# Patient Record
Sex: Male | Born: 1965
Health system: Western US, Academic
[De-identification: ages and names within clinical notes are randomized; demographics above are authoritative.]

## PROBLEM LIST (undated history)

## (undated) DIAGNOSIS — T82898A Other specified complication of vascular prosthetic devices, implants and grafts, initial encounter: Secondary | ICD-10-CM

## (undated) DIAGNOSIS — R0602 Shortness of breath: Secondary | ICD-10-CM

## (undated) DIAGNOSIS — N289 Disorder of kidney and ureter, unspecified: Secondary | ICD-10-CM

## (undated) DIAGNOSIS — Z992 Dependence on renal dialysis: Secondary | ICD-10-CM

## (undated) DIAGNOSIS — E119 Type 2 diabetes mellitus without complications: Secondary | ICD-10-CM

## (undated) DIAGNOSIS — Z8711 Personal history of peptic ulcer disease: Secondary | ICD-10-CM

## (undated) DIAGNOSIS — Z9289 Personal history of other medical treatment: Secondary | ICD-10-CM

## (undated) DIAGNOSIS — J42 Unspecified chronic bronchitis: Secondary | ICD-10-CM

## (undated) DIAGNOSIS — F419 Anxiety disorder, unspecified: Secondary | ICD-10-CM

## (undated) DIAGNOSIS — I10 Essential (primary) hypertension: Secondary | ICD-10-CM

## (undated) DIAGNOSIS — J189 Pneumonia, unspecified organism: Secondary | ICD-10-CM

## (undated) DIAGNOSIS — N186 End stage renal disease: Secondary | ICD-10-CM

## (undated) DIAGNOSIS — Z8719 Personal history of other diseases of the digestive system: Secondary | ICD-10-CM

## (undated) DIAGNOSIS — M1990 Unspecified osteoarthritis, unspecified site: Secondary | ICD-10-CM

## (undated) HISTORY — PX: FISTULAGRAM (ARMC HX): HXRAD1277

## (undated) HISTORY — PX: FOREIGN BODY REMOVAL: SHX962

## (undated) HISTORY — PX: ARTERIOVENOUS GRAFT PLACEMENT: SUR1029

---

## 2001-07-21 HISTORY — PX: AV FISTULA PLACEMENT: SHX1204

## 2014-01-03 ENCOUNTER — Encounter: Payer: Self-pay | Admitting: Transplant Surgery

## 2014-01-05 ENCOUNTER — Encounter: Payer: Self-pay | Admitting: OTHER M.D.

## 2016-06-18 ENCOUNTER — Inpatient Hospital Stay (HOSPITAL_BASED_OUTPATIENT_CLINIC_OR_DEPARTMENT_OTHER)
Admission: EM | Admit: 2016-06-18 | Discharge: 2016-06-22 | DRG: 280 | Disposition: A | Discharge status: Home / Self Care | Payer: Medicaid Other | Attending: Internal Medicine | Admitting: Internal Medicine

## 2016-06-18 ENCOUNTER — Encounter (HOSPITAL_BASED_OUTPATIENT_CLINIC_OR_DEPARTMENT_OTHER): Payer: Self-pay | Admitting: *Deleted

## 2016-06-18 ENCOUNTER — Emergency Department (HOSPITAL_BASED_OUTPATIENT_CLINIC_OR_DEPARTMENT_OTHER): Payer: Medicaid Other

## 2016-06-18 DIAGNOSIS — R9439 Abnormal result of other cardiovascular function study: Secondary | ICD-10-CM | POA: Diagnosis not present

## 2016-06-18 DIAGNOSIS — Z8249 Family history of ischemic heart disease and other diseases of the circulatory system: Secondary | ICD-10-CM | POA: Diagnosis not present

## 2016-06-18 DIAGNOSIS — N250 Renal osteodystrophy: Secondary | ICD-10-CM | POA: Diagnosis present

## 2016-06-18 DIAGNOSIS — Z7982 Long term (current) use of aspirin: Secondary | ICD-10-CM | POA: Diagnosis not present

## 2016-06-18 DIAGNOSIS — D696 Thrombocytopenia, unspecified: Secondary | ICD-10-CM | POA: Diagnosis present

## 2016-06-18 DIAGNOSIS — I219 Acute myocardial infarction, unspecified: Secondary | ICD-10-CM

## 2016-06-18 DIAGNOSIS — M899 Disorder of bone, unspecified: Secondary | ICD-10-CM | POA: Diagnosis present

## 2016-06-18 DIAGNOSIS — D631 Anemia in chronic kidney disease: Secondary | ICD-10-CM | POA: Diagnosis present

## 2016-06-18 DIAGNOSIS — Z880 Allergy status to penicillin: Secondary | ICD-10-CM

## 2016-06-18 DIAGNOSIS — D649 Anemia, unspecified: Secondary | ICD-10-CM | POA: Diagnosis present

## 2016-06-18 DIAGNOSIS — Z940 Kidney transplant status: Secondary | ICD-10-CM

## 2016-06-18 DIAGNOSIS — E8779 Other fluid overload: Secondary | ICD-10-CM

## 2016-06-18 DIAGNOSIS — I160 Hypertensive urgency: Secondary | ICD-10-CM | POA: Diagnosis present

## 2016-06-18 DIAGNOSIS — R7989 Other specified abnormal findings of blood chemistry: Secondary | ICD-10-CM

## 2016-06-18 DIAGNOSIS — I132 Hypertensive heart and chronic kidney disease with heart failure and with stage 5 chronic kidney disease, or end stage renal disease: Secondary | ICD-10-CM | POA: Diagnosis present

## 2016-06-18 DIAGNOSIS — J9811 Atelectasis: Secondary | ICD-10-CM | POA: Diagnosis present

## 2016-06-18 DIAGNOSIS — F419 Anxiety disorder, unspecified: Secondary | ICD-10-CM | POA: Diagnosis present

## 2016-06-18 DIAGNOSIS — I119 Hypertensive heart disease without heart failure: Secondary | ICD-10-CM | POA: Diagnosis present

## 2016-06-18 DIAGNOSIS — J45909 Unspecified asthma, uncomplicated: Secondary | ICD-10-CM | POA: Diagnosis present

## 2016-06-18 DIAGNOSIS — R9431 Abnormal electrocardiogram [ECG] [EKG]: Secondary | ICD-10-CM | POA: Diagnosis present

## 2016-06-18 DIAGNOSIS — Z8711 Personal history of peptic ulcer disease: Secondary | ICD-10-CM | POA: Diagnosis not present

## 2016-06-18 DIAGNOSIS — J42 Unspecified chronic bronchitis: Secondary | ICD-10-CM | POA: Diagnosis present

## 2016-06-18 DIAGNOSIS — Z79899 Other long term (current) drug therapy: Secondary | ICD-10-CM | POA: Diagnosis not present

## 2016-06-18 DIAGNOSIS — Z881 Allergy status to other antibiotic agents status: Secondary | ICD-10-CM | POA: Diagnosis not present

## 2016-06-18 DIAGNOSIS — R0789 Other chest pain: Secondary | ICD-10-CM | POA: Diagnosis present

## 2016-06-18 DIAGNOSIS — E872 Acidosis: Secondary | ICD-10-CM | POA: Diagnosis present

## 2016-06-18 DIAGNOSIS — E119 Type 2 diabetes mellitus without complications: Secondary | ICD-10-CM

## 2016-06-18 DIAGNOSIS — R0902 Hypoxemia: Secondary | ICD-10-CM | POA: Diagnosis present

## 2016-06-18 DIAGNOSIS — E1122 Type 2 diabetes mellitus with diabetic chronic kidney disease: Secondary | ICD-10-CM | POA: Diagnosis present

## 2016-06-18 DIAGNOSIS — Z888 Allergy status to other drugs, medicaments and biological substances status: Secondary | ICD-10-CM | POA: Diagnosis not present

## 2016-06-18 DIAGNOSIS — E785 Hyperlipidemia, unspecified: Secondary | ICD-10-CM | POA: Diagnosis present

## 2016-06-18 DIAGNOSIS — I509 Heart failure, unspecified: Secondary | ICD-10-CM | POA: Diagnosis not present

## 2016-06-18 DIAGNOSIS — Z992 Dependence on renal dialysis: Secondary | ICD-10-CM

## 2016-06-18 DIAGNOSIS — I255 Ischemic cardiomyopathy: Secondary | ICD-10-CM | POA: Diagnosis present

## 2016-06-18 DIAGNOSIS — I5021 Acute systolic (congestive) heart failure: Secondary | ICD-10-CM | POA: Diagnosis present

## 2016-06-18 DIAGNOSIS — R079 Chest pain, unspecified: Secondary | ICD-10-CM | POA: Diagnosis not present

## 2016-06-18 DIAGNOSIS — N186 End stage renal disease: Secondary | ICD-10-CM | POA: Diagnosis present

## 2016-06-18 DIAGNOSIS — I2119 ST elevation (STEMI) myocardial infarction involving other coronary artery of inferior wall: Secondary | ICD-10-CM | POA: Diagnosis present

## 2016-06-18 DIAGNOSIS — N2581 Secondary hyperparathyroidism of renal origin: Secondary | ICD-10-CM | POA: Diagnosis present

## 2016-06-18 DIAGNOSIS — R600 Localized edema: Secondary | ICD-10-CM | POA: Diagnosis present

## 2016-06-18 DIAGNOSIS — I159 Secondary hypertension, unspecified: Secondary | ICD-10-CM

## 2016-06-18 DIAGNOSIS — R072 Precordial pain: Secondary | ICD-10-CM | POA: Diagnosis not present

## 2016-06-18 DIAGNOSIS — I10 Essential (primary) hypertension: Secondary | ICD-10-CM | POA: Diagnosis not present

## 2016-06-18 DIAGNOSIS — I25119 Atherosclerotic heart disease of native coronary artery with unspecified angina pectoris: Secondary | ICD-10-CM | POA: Diagnosis not present

## 2016-06-18 DIAGNOSIS — I2510 Atherosclerotic heart disease of native coronary artery without angina pectoris: Secondary | ICD-10-CM

## 2016-06-18 DIAGNOSIS — E8729 Other acidosis: Secondary | ICD-10-CM

## 2016-06-18 DIAGNOSIS — R778 Other specified abnormalities of plasma proteins: Secondary | ICD-10-CM

## 2016-06-18 HISTORY — DX: Personal history of other medical treatment: Z92.89

## 2016-06-18 HISTORY — DX: Essential (primary) hypertension: I10

## 2016-06-18 HISTORY — DX: Unspecified chronic bronchitis: J42

## 2016-06-18 HISTORY — DX: Personal history of peptic ulcer disease: Z87.11

## 2016-06-18 HISTORY — DX: Dependence on renal dialysis: Z99.2

## 2016-06-18 HISTORY — DX: Unspecified osteoarthritis, unspecified site: M19.90

## 2016-06-18 HISTORY — DX: Dependence on renal dialysis: N18.6

## 2016-06-18 HISTORY — DX: Pneumonia, unspecified organism: J18.9

## 2016-06-18 HISTORY — DX: Personal history of other diseases of the digestive system: Z87.19

## 2016-06-18 HISTORY — DX: Disorder of kidney and ureter, unspecified: N28.9

## 2016-06-18 HISTORY — DX: Anxiety disorder, unspecified: F41.9

## 2016-06-18 HISTORY — DX: Type 2 diabetes mellitus without complications: E11.9

## 2016-06-18 LAB — BASIC METABOLIC PANEL
ANION GAP: 18 — AB (ref 5–15)
BUN: 60 mg/dL — ABNORMAL HIGH (ref 6–20)
CO2: 24 mmol/L (ref 22–32)
Calcium: 8 mg/dL — ABNORMAL LOW (ref 8.9–10.3)
Chloride: 97 mmol/L — ABNORMAL LOW (ref 101–111)
Creatinine, Ser: 21.06 mg/dL — ABNORMAL HIGH (ref 0.61–1.24)
GFR calc Af Amer: 2 mL/min — ABNORMAL LOW (ref >60)
GFR, EST NON AFRICAN AMERICAN: 2 mL/min — AB (ref >60)
GLUCOSE: 109 mg/dL — AB (ref 65–99)
POTASSIUM: 3.8 mmol/L (ref 3.5–5.1)
SODIUM: 139 mmol/L (ref 135–145)

## 2016-06-18 LAB — CBC WITH DIFFERENTIAL/PLATELET
BASOS ABS: 0 10*3/uL (ref 0.0–0.1)
Basophils Relative: 0 %
Eosinophils Absolute: 0.2 10*3/uL (ref 0.0–0.7)
Eosinophils Relative: 6 %
HEMATOCRIT: 34.9 % — AB (ref 39.0–52.0)
HEMOGLOBIN: 11.6 g/dL — AB (ref 13.0–17.0)
LYMPHS PCT: 25 %
Lymphs Abs: 1 10*3/uL (ref 0.7–4.0)
MCH: 30.8 pg (ref 26.0–34.0)
MCHC: 33.2 g/dL (ref 30.0–36.0)
MCV: 92.6 fL (ref 78.0–100.0)
MONOS PCT: 11 %
Monocytes Absolute: 0.4 10*3/uL (ref 0.1–1.0)
Neutro Abs: 2.4 10*3/uL (ref 1.7–7.7)
Neutrophils Relative %: 58 %
PLATELETS: 95 10*3/uL — AB (ref 150–400)
RBC: 3.77 MIL/uL — AB (ref 4.22–5.81)
RDW: 15 % (ref 11.5–15.5)
WBC: 4 10*3/uL (ref 4.0–10.5)

## 2016-06-18 LAB — GLUCOSE, CAPILLARY: Glucose-Capillary: 91 mg/dL (ref 65–99)

## 2016-06-18 LAB — TROPONIN I
TROPONIN I: 0.06 ng/mL — AB (ref <0.03)
Troponin I: 0.06 ng/mL (ref <0.03)

## 2016-06-18 LAB — HEPATITIS B SURFACE ANTIGEN: Hepatitis B Surface Ag: NEGATIVE

## 2016-06-18 LAB — MRSA PCR SCREENING: MRSA BY PCR: NEGATIVE

## 2016-06-18 LAB — ALT: ALT: 30 U/L (ref 17–63)

## 2016-06-18 MED ORDER — METOPROLOL TARTRATE 50 MG PO TABS
50.0000 mg | ORAL_TABLET | Freq: Two times a day (BID) | ORAL | Status: DC
  Administered 2016-06-19: 50 mg via ORAL
  Filled 2016-06-18: qty 1

## 2016-06-18 MED ORDER — CALCITRIOL 0.5 MCG PO CAPS
0.5000 ug | ORAL_CAPSULE | ORAL | Status: DC
  Administered 2016-06-20: 0.5 ug via ORAL
  Filled 2016-06-18: qty 2

## 2016-06-18 MED ORDER — AMLODIPINE BESYLATE 5 MG PO TABS
5.0000 mg | ORAL_TABLET | Freq: Every day | ORAL | Status: DC
  Administered 2016-06-19 – 2016-06-21 (×3): 5 mg via ORAL
  Filled 2016-06-18 (×3): qty 1

## 2016-06-18 MED ORDER — ACETAMINOPHEN 650 MG RE SUPP: 650.0000 mg | Freq: Four times a day (QID) | RECTAL | Status: DC | PRN

## 2016-06-18 MED ORDER — HYDROCODONE-ACETAMINOPHEN 5-325 MG PO TABS: 1.0000 | ORAL_TABLET | ORAL | Status: DC | PRN

## 2016-06-18 MED ORDER — RENA-VITE PO TABS
1.0000 | ORAL_TABLET | Freq: Every day | ORAL | Status: DC
  Administered 2016-06-19 – 2016-06-21 (×3): 1 via ORAL
  Filled 2016-06-18 (×3): qty 1

## 2016-06-18 MED ORDER — MORPHINE SULFATE (PF) 4 MG/ML IV SOLN
4.0000 mg | Freq: Once | INTRAVENOUS | Status: AC
  Administered 2016-06-18: 4 mg via INTRAVENOUS
  Filled 2016-06-18: qty 1

## 2016-06-18 MED ORDER — ONDANSETRON HCL 4 MG/2ML IJ SOLN: 4.0000 mg | Freq: Four times a day (QID) | INTRAMUSCULAR | Status: DC | PRN

## 2016-06-18 MED ORDER — ACETAMINOPHEN 325 MG PO TABS: 650.0000 mg | ORAL_TABLET | Freq: Four times a day (QID) | ORAL | Status: DC | PRN

## 2016-06-18 MED ORDER — ASPIRIN 81 MG PO CHEW
324.0000 mg | CHEWABLE_TABLET | Freq: Once | ORAL | Status: AC
  Administered 2016-06-18: 324 mg via ORAL
  Filled 2016-06-18: qty 4

## 2016-06-18 MED ORDER — NITROGLYCERIN 2 % TD OINT
1.0000 [in_us] | TOPICAL_OINTMENT | Freq: Once | TRANSDERMAL | Status: AC
  Administered 2016-06-18: 1 [in_us] via TOPICAL
  Filled 2016-06-18: qty 1

## 2016-06-18 MED ORDER — MORPHINE SULFATE (PF) 2 MG/ML IV SOLN
1.0000 mg | INTRAVENOUS | Status: DC | PRN
  Administered 2016-06-19: 1 mg via INTRAVENOUS
  Filled 2016-06-18: qty 1

## 2016-06-18 MED ORDER — HYDRALAZINE HCL 20 MG/ML IJ SOLN
5.0000 mg | INTRAMUSCULAR | Status: DC | PRN
  Administered 2016-06-19 – 2016-06-20 (×3): 5 mg via INTRAVENOUS
  Filled 2016-06-18 (×3): qty 1

## 2016-06-18 MED ORDER — CINACALCET HCL 30 MG PO TABS
60.0000 mg | ORAL_TABLET | Freq: Every day | ORAL | Status: DC
  Administered 2016-06-19 – 2016-06-22 (×4): 60 mg via ORAL
  Filled 2016-06-18 (×4): qty 2

## 2016-06-18 MED ORDER — IPRATROPIUM-ALBUTEROL 18-103 MCG/ACT IN AERO: 2.0000 | INHALATION_SPRAY | RESPIRATORY_TRACT | Status: DC | PRN

## 2016-06-18 MED ORDER — INSULIN ASPART 100 UNIT/ML ~~LOC~~ SOLN: 0.0000 [IU] | Freq: Three times a day (TID) | SUBCUTANEOUS | Status: DC

## 2016-06-18 MED ORDER — IPRATROPIUM-ALBUTEROL 0.5-2.5 (3) MG/3ML IN SOLN: 3.0000 mL | RESPIRATORY_TRACT | Status: DC | PRN

## 2016-06-18 MED ORDER — ONDANSETRON HCL 4 MG PO TABS: 4.0000 mg | ORAL_TABLET | Freq: Four times a day (QID) | ORAL | Status: DC | PRN

## 2016-06-18 MED ORDER — SODIUM CHLORIDE 0.9% FLUSH
3.0000 mL | Freq: Two times a day (BID) | INTRAVENOUS | Status: DC
  Administered 2016-06-19 – 2016-06-22 (×7): 3 mL via INTRAVENOUS

## 2016-06-18 NOTE — ED Notes (Signed)
MD at bedside. 

## 2016-06-18 NOTE — ED Provider Notes (Signed)
CSN: [id number]     Arrival date & time 06/18/16  1005 History   First MD Initiated Contact with Patient 06/18/16 1012     Chief Complaint  Patient presents with  . Shortness of Breath     (Consider location/radiation/quality/duration/timing/severity/associated sxs/prior Treatment) HPI   Jeffery Riley is a(n) 50 y.o. male who presents TO THE ed With chief complaint of needing dialysis. Patient states that he just moved to the area and had difficulty establishing follow-up care due to insurance issues. The patient was scheduled to dialyze today , however, when he went he states that they've turned him away because he needed a physical. He came to the emergency department. 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 He states he is very disciplined with his care, but has never missed a dialysis session. He states that he has had worsening shortness of breath and chest pressure beginning 2 days ago. His last dialysis was 5 days ago. He has also noticed swelling in his ankles for the past 3 days which is new.Denies fevers, chills, myalgias, arthralgias. Denies chest pain. radiation to left arm, jaw or back, or diaphoresis. . Denies headaches, light headedness, weakness, visual disturbances. Denies abdominal pain, nausea, vomiting, diarrhea or constipation.    Past Medical History  Diagnosis Date  . Dialysis patient (HCC)   . Hypertension   . Diabetes mellitus without complication (HCC)   . Blood transfusion without reported diagnosis   . Anxiety    Past Surgical History  Procedure Laterality Date  . Av fistula placement     No family history on file. Social History  Substance Use Topics  . Smoking status: Never Smoker   . Smokeless tobacco: Never Used  . Alcohol Use: No    Review of Systems  Ten systems reviewed and are negative for acute change,  except as noted in the HPI.    Allergies  Penicillins; Clonidine derivatives; Compazine; and Levaquin  Home Medications   Prior to Admission medications   Medication Sig Start Date End Date Taking? Authorizing Provider  albuterol-ipratropium (COMBIVENT) 18-103 MCG/ACT inhaler Inhale 2 puffs into the lungs every 4 (four) hours as needed for wheezing or shortness of breath.   Yes Historical Provider, MD  amLODipine (NORVASC) 5 MG tablet Take 5 mg by mouth daily.   Yes Historical Provider, MD  cinacalcet (SENSIPAR) 60 MG tablet Take 60 mg by mouth daily.   Yes Historical Provider, MD  metoprolol (LOPRESSOR) 50 MG tablet Take 50 mg by mouth 2 (two) times daily.   Yes Historical Provider, MD   BP 169/93 mmHg  Pulse 92  Temp(Src) 99.1 F (37.3 C) (Oral)  Resp 18  Ht 5\' 7"  (1.702 m)  Wt 68.947 kg  BMI 23.80 kg/m2  SpO2 97% Physical Exam  Constitutional: He appears well-developed and well-nourished. No distress.  HENT:  Head: Normocephalic and atraumatic.  Eyes: Conjunctivae are normal. No scleral icterus.  Neck: Normal range of motion. Neck supple. JVD present.  JVD Measures the length of his neck. Venous distension across the forehead.  Cardiovascular: Normal rate, regular rhythm and normal heart sounds.   2+ pitting edema BL LE  Pulmonary/Chest: Effort normal and breath sounds normal. No respiratory distress. He has no rales.  Dyspneic at rest  Abdominal: Soft. There is no tenderness.  Musculoskeletal: He exhibits no edema.  Neurological:  He is alert.  Skin: Skin is warm and dry. He is not diaphoretic.  Psychiatric: His behavior is normal.  Nursing note and vitals reviewed.   ED Course  Procedures (including critical care time) Labs Review Labs Reviewed  BASIC METABOLIC PANEL - Abnormal; Notable for the following:    Chloride 97 (*)    Glucose, Bld 109 (*)    BUN 60 (*)    Creatinine, Ser 21.06 (*)    Calcium 8.0 (*)    GFR calc non Af Amer 2 (*)    GFR calc Af Amer 2  (*)    Anion gap 18 (*)    All other components within normal limits  CBC WITH DIFFERENTIAL/PLATELET - Abnormal; Notable for the following:    RBC 3.77 (*)    Hemoglobin 11.6 (*)    HCT 34.9 (*)    Platelets 95 (*)    All other components within normal limits  TROPONIN I - Abnormal; Notable for the following:    Troponin I 0.06 (*)    All other components within normal limits    Imaging Review Dg Chest 2 View  06/18/2016  CLINICAL DATA:  Shortness of breath, chest pain for 3 days EXAM: CHEST  2 VIEW COMPARISON:  6/27/ 17 FINDINGS: Cardiomegaly again noted. No infiltrate or pleural effusion. No pulmonary edema. Mild degenerative changes thoracic spine again noted. IMPRESSION: No active cardiopulmonary disease.  Cardiomegaly again noted. Electronically Signed   By: Natasha Mead M.D.   On: 06/18/2016 11:26   I have personally reviewed and evaluated these images and lab results as part of my medical decision-making.   EKG Interpretation   Date/Time:  Thursday June 18 2016 10:31:25 EDT Ventricular Rate:  92 PR Interval:    QRS Duration: 90 QT Interval:  421 QTC Calculation: 521 R Axis:   39 Text Interpretation:  Sinus rhythm Probable left atrial enlargement RSR'  in V1 or V2, right VCD or RVH Prolonged QT interval No old tracing to  compare Confirmed by JACUBOWITZ  MD, SAM [phone]) on 06/18/2016 10:44:01 AM      MDM   Final diagnoses:  Elevated troponin  ESRD (end stage renal disease) on dialysis (HCC)  Other hypervolemia  Increased anion gap metabolic acidosis    I spoke with the nurse and the social worker at  [facility] on [address]  Patient stated they had been trying to contact the patient repeated the. He was set up for an HNP with [facility] yesterday. However, they were unable to tell him when his appointment was. It would be unable to dialyze the patient today because he has not yet had his H&P.     1:33 PM BP 169/93 mmHg  Pulse 92   Temp(Src) 99.1 F (37.3 C) (Oral)  Resp 18  Ht  (1.702 m)  Wt 68.947 kg  BMI 23.80 kg/m2  SpO2 97% Patient CP free after morphine and nitro paste   1:53 PM Spoke with DR. Schertz. Patient will be dialyzed today. Patient accepted for admission by Dr.Elgargawy.   Arthor Captain, PA-C 06/18/16 1628  Doug Sou, MD 06/19/16 [phone]

## 2016-06-18 NOTE — Progress Notes (Signed)
49 year old male with ESRD on dialysis for 35 years, presents to [facility] as he missed his dialysis for last 5 days, recently moved from [place], unfortunately dialysis could not be arranged with for [facility] secondary to insurance issues and encouraged to contact information , agent presents with dyspnea and chest pain , lab significant for creatinine of 21, but no hyperkalemia, vital signs stable, no pulmonary edema on chest x-ray, patient accepted to stepdown to start on dialysis, but if he is chest pain-free he can come to telemetry 6 [place]. requested ED to discuss with renal. Huey Bienenstock MD.

## 2016-06-18 NOTE — ED Notes (Signed)
[facility] is aware of transfer to 3S10 at [facility]

## 2016-06-18 NOTE — Consult Note (Signed)
[facility] Renal Consultation Note  Indication for Consultation:  Management of ESRD/hemodialysis; anemia, hypertension/volume and secondary hyperparathyroidism  HPI: Jeffery Riley is a 50 y.o. male with ESRD sec HTN ,(Chrionic HD  Schedule TTS) last HD was Saturday 06/12/16 in [place] , [place] care [facility] but unable to communicate with Pt to start him on schedule in [facility].He presented to [facility] ER in [place]  a 3 day history of intermittent left anterior chest "pressure"and sob with exertion and lower extremity edema. He tells me "Have never missed a day of dialysis in 28 years .Denies fevers, chills, N/V/D.  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 Has Right upper arm AVGG in past 13 years .Has no urine out put. "control my volume with exercising =running and swimming. 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 .    Moving  To [place] and will live in  in [place] to live with mother in [place] [facility]. Per friend in room will live off [address] .      Past Medical History  Diagnosis Date  . Dialysis patient ([place])   . Hypertension   . Diabetes mellitus without complication ([place])   . Blood transfusion without reported diagnosis   . Anxiety   . Renal disorder   . ESRD (end stage renal disease) ([place])     tues thurs sat    Past Surgical History  Procedure Laterality Date  . Av fistula placement       Family history= ho hypertension/ no family members on HD  Social history= HE denies tobacco, illict drug or tobacco "ever''   reports that he has never smoked. He has never used smokeless tobacco. He reports that he does not drink alcohol or use illicit drugs.   Allergies  Allergen Reactions  . Penicillins Shortness Of Breath  . Clonidine Derivatives Other  (See Comments)    Muscle spasms  . Compazine [Prochlorperazine] Other (See Comments)    hallucinations  . Levaquin [Levofloxacin In D5w] Diarrhea    Prior to Admission medications   Medication Sig Start Date End Date Taking? Authorizing Provider  albuterol-ipratropium (COMBIVENT) 18-103 MCG/ACT inhaler Inhale 2 puffs into the lungs every 4 (four) hours as needed for wheezing or shortness of breath.   Yes Historical Provider, MD  amLODipine (NORVASC) 5 MG tablet Take 5 mg by mouth daily.   Yes Historical Provider, MD  cinacalcet (SENSIPAR) 60 MG tablet Take 60 mg by mouth daily.   Yes Historical Provider, MD  metoprolol (LOPRESSOR) 50 MG tablet Take 50 mg by mouth 2 (two) times daily.   Yes Historical Provider, MD    GPQ:DIYMEBRAXENMM **OR** acetaminophen, hydrALAZINE, HYDROcodone-acetaminophen, ipratropium-albuterol, morphine injection, ondansetron **OR** ondansetron (ZOFRAN) IV  Results for orders placed or performed during the hospital encounter of 06/18/16 (from the past 48 hour(s))  Basic metabolic panel     Status: Abnormal   Collection Time: 06/18/16 11:15 AM  Result Value Ref Range   Sodium 139 135 - 145 mmol/L   Potassium 3.8 3.5 - 5.1 mmol/L   Chloride 97 (L) 101 - 111 mmol/L   CO2 24 22 - 32 mmol/L   Glucose, Bld 109 (H) 65 - 99 mg/dL   BUN 60 (H) 6 - 20 mg/dL  Creatinine, Ser 21.06 (H) 0.61 - 1.24 mg/dL   Calcium 8.0 (L) 8.9 - 10.3 mg/dL   GFR calc non Af Amer 2 (L) >60 mL/min   GFR calc Af Amer 2 (L) >60 mL/min    Comment: (NOTE) The eGFR has been calculated using the CKD EPI equation. This calculation has not been validated in all clinical situations. eGFR's persistently <60 mL/min signify possible Chronic Kidney Disease.    Anion gap 18 (H) 5 - 15  CBC with Differential     Status: Abnormal   Collection Time: 06/18/16 11:15 AM  Result Value Ref Range   WBC 4.0 4.0 - 10.5 K/uL   RBC 3.77 (L) 4.22 - 5.81 MIL/uL   Hemoglobin 11.6 (L) 13.0 - 17.0 g/dL   HCT 34.9  (L) 39.0 - 52.0 %   MCV 92.6 78.0 - 100.0 fL   MCH 30.8 26.0 - 34.0 pg   MCHC 33.2 30.0 - 36.0 g/dL   RDW 15.0 11.5 - 15.5 %   Platelets 95 (L) 150 - 400 K/uL    Comment: REPEATED TO VERIFY SPECIMEN CHECKED FOR CLOTS PLATELET COUNT CONFIRMED BY SMEAR    Neutrophils Relative % 58 %   Lymphocytes Relative 25 %   Monocytes Relative 11 %   Eosinophils Relative 6 %   Basophils Relative 0 %   Neutro Abs 2.4 1.7 - 7.7 K/uL   Lymphs Abs 1.0 0.7 - 4.0 K/uL   Monocytes Absolute 0.4 0.1 - 1.0 K/uL   Eosinophils Absolute 0.2 0.0 - 0.7 K/uL   Basophils Absolute 0.0 0.0 - 0.1 K/uL   Smear Review LARGE PLATELETS PRESENT     Comment: SPECIMEN CHECKED FOR CLOTS PLATELET COUNT CONFIRMED BY SMEAR PLATELETS APPEAR DECREASED   Troponin I     Status: Abnormal   Collection Time: 06/18/16 11:15 AM  Result Value Ref Range   Troponin I 0.06 (HH) <0.03 ng/mL    Comment: REPEATED TO VERIFY CRITICAL RESULT CALLED TO, READ BACK BY AND VERIFIED WITH: JOSS BENTON RN @1220  06/18/16 OLSONM    .  ROS: see hpi  Physical Exam: Filed Vitals:   06/18/16 1430 06/18/16 1536  BP: 167/94 178/103  Pulse: 95 93  Temp:  98 F (36.7 C)  Resp: 17      General: alert AAM WD, WN, NAD OX3 / pleasant  HEENT: [place] , MMM Neck: no jvd, supple  Heart: RRR, no mur. rub,or gallop Lungs: CTA , Non labored breathijng Abdomen: BS pos , soft ,Nontender, non distended mass,no mass or ascites Extremities: bipedal edema 2+ Left 1+right Skin: no overt rash Neuro: alert ox3. No acute focal deficits noted No asterixis  Dialysis Access: RUA AVG pos. Bruit / mild aneurysmal areas  Dialysis Orders: Center: last in [place]  [facility]   on TTS  . EDW 68kg per pt /67 records  HD Bath 2k/ 2.25ca Time 3hr 20mn Heparin 2000. Access RUA AVG     Po calcitriol 0.529m /HD     Mircera 10062mq2 weeks last given 05/26/16   Venofer  100 mg load through 06/20/16  Other [place] from [place] [facility].4 (05/26/16) tfs 21%  Corec Ca 8.9 phos 4.1  pth 1,072  =all on 05/26/16)   Assessment/Plan 1. Volume overload sec to missed HD= hd to today /standing wt is 68 at his edw will uf 2 l tonight appears stable and could possible be dc when op HD arranged. Lower edw at dc  2. ESRD -  With  uremia Scr 21 sec to missed hd = HD today/[place] Kid[place]. Needs clipped to his closest center ="[address] [place] away per friend in room" 3. Hypertension/volume  - bp up sec to missed hd / tells me tae Amlodipine 174mq day and Metoprolol 5041mid 4. Anemia  - no esa with hgb 11.6  Hold fe for now  5. Metabolic bone disease -  Po vit d on hd/ ^ Pth (was not able to get insurance to pay for sensipar /have arranged [facility] [employer] )phoslo binder  6. Nutrition - renal vit / renal diet   DavErnest HaberA-C [phone]/2017, 4:42 PM    Pt seen, examined and agree w A/P as above.  RobKelly Splinter CarNewell Rubbermaidger [phone] cell [phone]/2017, 1:32 PM

## 2016-06-18 NOTE — H&P (Signed)
History and Physical    Jeffery Riley WUJ:[id number] DOB: 1966/03/29 DOA: 06/18/2016  PCP: No PCP Per Patient Patient coming from: [facility]/ traveling from [place]/staying with mother who lives in [place]  Chief Complaint: chest pain/sob/needing dialysis  HPI: Jeffery Riley is a very pleasant 50 y.o. male with medical history significant for end-stage renal disease on Tuesday Thursday Saturday schedule dialysis s/p 2 previous kidney transplants, hypertension, diabetes, presents to [facility] with the chief complaint of chest pain and shortness of breath and lower extremity edema. Initial evaluation reveals creatinine greater than 21.0.  Information is obtained from the patient. He describes a 3 day history of intermittent left anterior chest "pressure". He has not been dialyzed for 5 days. Associated symptoms include gradual worsening shortness of breath with exertion and lower extremity edema. He states that chest pain is nonradiating. He denies palpitations diaphoresis nausea vomiting. He denies abdominal pain diarrhea fever chills cough. He denies headache visual disturbances syncope or near-syncope. Time of my examination he reports resolved chest pain.    ED Course: [facility] he received male and Nitropaste  Review of Systems: As per HPI otherwise 10 point review of systems negative.   Ambulatory Status: Ambulates independently with a steady gait.  Past Medical History  Diagnosis Date  . Dialysis patient (HCC)   . Hypertension   . Diabetes mellitus without complication (HCC)   . Blood transfusion without reported diagnosis   . Anxiety   . Renal disorder   . ESRD (end stage renal disease) (HCC)     tues thurs sat    Past Surgical History  Procedure Laterality Date  . Av fistula placement      Social History   Social History  . Marital Status: Single    Spouse Name: [name]  . Number of Children: [name]  . Years of Education: [name]    Occupational History  . Not on file.   Social History Main Topics  . Smoking status: Never Smoker   . Smokeless tobacco: Never Used  . Alcohol Use: No  . Drug Use: No  . Sexual Activity: Not on file   Other Topics Concern  . Not on file   Social History Narrative  . No narrative on file  He is currently on disability. When he was in [place] he worked as a Animal nutritionist in [place]  Allergies  Allergen Reactions  . Penicillins Shortness Of Breath  . Clonidine Derivatives Other (See Comments)    Muscle spasms  . Compazine [Prochlorperazine] Other (See Comments)    hallucinations  . Levaquin [Levofloxacin In D5w] Diarrhea    No family history on file. mother with diabetes. No cancer. Father med hx unknown  Prior to Admission medications   Medication Sig Start Date End Date Taking? Authorizing Provider  albuterol-ipratropium (COMBIVENT) 18-103 MCG/ACT inhaler Inhale 2 puffs into the lungs every 4 (four) hours as needed for wheezing or shortness of breath.   Yes Historical Provider, MD  amLODipine (NORVASC) 5 MG tablet Take 5 mg by mouth daily.   Yes Historical Provider, MD  cinacalcet (SENSIPAR) 60 MG tablet Take 60 mg by mouth daily.   Yes Historical Provider, MD  metoprolol (LOPRESSOR) 50 MG tablet Take 50 mg by mouth 2 (two) times daily.   Yes Historical Provider, MD    Physical Exam: Filed Vitals:   06/18/16 1330 06/18/16 1400 06/18/16 1430 06/18/16 1536  BP: 172/99 166/96 167/94 178/103  Pulse: 94 94 95  93  Temp:  97.9 F (36.6 C)  98 F (36.7 C)  TempSrc:  Oral  Oral  Resp: 14 16 17    Height:    5\' 7"  (1.702 m)  Weight:    70.1 kg (154 lb 8.7 oz)  SpO2: 94% 95% 94% 96%     General:  Appears calm and comfortable,Cooperative Eyes:  PERRL, EOMI, normal lids, iris ENT:  grossly normal hearing, lips & tongue, mmm Neck:  no LAD, masses or thyromegaly Cardiovascular:  RRR, no m/r/g. Trace LE edema.  Respiratory:  Breath sounds with good air flow, very fine  crackles bilateral bases no wheeze no rhonchi. Normal respiratory effort. Abdomen:  soft, ntnd, no guarding or rebounding Skin:  no rash or induration seen on limited exam Musculoskeletal:  grossly normal tone BUE/BLE, good ROM, no bony abnormality Psychiatric:  grossly normal mood and affect, speech fluent and appropriate, AOx3 Neurologic:  CN 2-12 grossly intact, moves all extremities in coordinated fashion, sensation intact  Labs on Admission: I have personally reviewed following labs and imaging studies  CBC:  Recent Labs Lab 06/18/16 1115  WBC 4.0  NEUTROABS 2.4  HGB 11.6*  HCT 34.9*  MCV 92.6  PLT 95*   Basic Metabolic Panel:  Recent Labs Lab 06/18/16 1115  NA 139  K 3.8  CL 97*  CO2 24  GLUCOSE 109*  BUN 60*  CREATININE 21.06*  CALCIUM 8.0*   GFR: Estimated Creatinine Clearance: 3.9 mL/min (by C-G formula based on Cr of 21.06). Liver Function Tests: No results for input(s): AST, ALT, ALKPHOS, BILITOT, PROT, ALBUMIN in the last 168 hours. No results for input(s): LIPASE, AMYLASE in the last 168 hours. No results for input(s): AMMONIA in the last 168 hours. Coagulation Profile: No results for input(s): INR, PROTIME in the last 168 hours. Cardiac Enzymes:  Recent Labs Lab 06/18/16 1115  TROPONINI 0.06*   BNP (last 3 results) No results for input(s): PROBNP in the last 8760 hours. HbA1C: No results for input(s): HGBA1C in the last 72 hours. CBG: No results for input(s): GLUCAP in the last 168 hours. Lipid Profile: No results for input(s): CHOL, HDL, LDLCALC, TRIG, CHOLHDL, LDLDIRECT in the last 72 hours. Thyroid Function Tests: No results for input(s): TSH, T4TOTAL, FREET4, T3FREE, THYROIDAB in the last 72 hours. Anemia Panel: No results for input(s): VITAMINB12, FOLATE, FERRITIN, TIBC, IRON, RETICCTPCT in the last 72 hours. Urine analysis: No results found for: COLORURINE, APPEARANCEUR, LABSPEC, PHURINE, GLUCOSEU, HGBUR, BILIRUBINUR, KETONESUR,  PROTEINUR, UROBILINOGEN, NITRITE, LEUKOCYTESUR  Creatinine Clearance: Estimated Creatinine Clearance: 3.9 mL/min (by C-G formula based on Cr of 21.06).  Sepsis Labs: @LABRCNTIP (procalcitonin:4,lacticidven:4) )No results found for this or any previous visit (from the past 240 hour(s)).   Radiological Exams on Admission: Dg Chest 2 View  06/18/2016  CLINICAL DATA:  Shortness of breath, chest pain for 3 days EXAM: CHEST  2 VIEW COMPARISON:  6/27/ 17 FINDINGS: Cardiomegaly again noted. No infiltrate or pleural effusion. No pulmonary edema. Mild degenerative changes thoracic spine again noted. IMPRESSION: No active cardiopulmonary disease.  Cardiomegaly again noted. Electronically Signed   By: Natasha Mead M.D.   On: 06/18/2016 11:26    EKG: Independently reviewed.Sinus rhythm Probable left atrial enlargement RSR' in V1 or V2, right VCD or RVH Prolonged QT interval  Assessment/Plan Principal Problem:   ESRD (end stage renal disease) on dialysis [facility]) Active Problems:   Chest pain   Hypertension   Diabetes mellitus without complication (HCC)   Anxiety   Thrombocytopenia (HCC)   #  1. End-stage renal disease on dialysis. Patient has not had dialysis for 5 days due to travel. Creatinine 1.06 baseline unknown. Lower extremity edema intermittent chest pain. Chest x-ray with no active cardiopulmonary disease -Admit to step down -Nephrology consult for dialysis today - Appreciate nephrology assistance  #2. Chest pain. Atypical. Likely related to missing dialysis. Heart score 4. Initial troponin 0.06. Likely related to renal failure EKG as noted above. -will trend  troponin -Serial EKG -Supportive therapy  #3. Hypertension. Uncontrolled in the emergency department. Clearly secondary to missed dialysis. I medications include amlodipine and metoprolol -Continue amlodipine and metoprolol -When necessary hydralazine -Monitor  #4. Diabetes. Serum glucose 109 on admission. Diet controlled -Obtain  a hemoglobin A1c -Monitor CBGs -Use sliding scale insulin as needed -Carb modified diet  #5. Anxiety. Stable at baseline.  #6. Thrombocytopenia. Platelets 95. Denies EtOH no signs symptoms of bleeding -Monitor   DVT prophylaxis: scd  Code Status: full  Family Communication: none present  Disposition Plan: home when ready  Consults called: dr Arta Silence  Admission status: inpatient    Gwenyth Bender MD [facility]  If 7PM-7AM, please contact night-coverage [email or web address] Password [facility]  06/18/2016, 4:53 PM

## 2016-06-18 NOTE — ED Notes (Signed)
Pt reports he is new to the area. Last dialysis was Saturday (6 days ago). Pt reports he has been to [facility] x 2 and was told his labs were "too healthy to dialyze". Reports he had an appt with the dialysis center in [place] this morning and was told he could not be seen without having a "physical exam" and was advised to come to the hospital. Pt reports today he is SOB and having chest pain. BLE swelling noted. Pt has fistula in his right arm. Restricted extremity band applied

## 2016-06-18 NOTE — ED Notes (Signed)
Attempted to call report to receiving nurse at [facility]. Nurse unable to take report at this time. Will call back in 10 min.

## 2016-06-18 NOTE — ED Provider Notes (Signed)
Complains of shortness of breath and diffuse chest tightness, with pleuritic component constant for the past 3 days. He last had dialysis 5 days ago. Patient is newly arrived here from [place]. On exam no distress speaks in paragraphs HEENT exam no facial asymmetry neck supple positive JVD lungs clear auscultation heart regular rate and rhythm abdomen nondistended nontender. Bilateral lower extremities with trace pretibial pitting edema right upper extremity with AV graft with good thrill.  Doug Sou, MD 06/18/16 [phone]

## 2016-06-18 NOTE — ED Notes (Signed)
Patient transported to X-ray 

## 2016-06-19 ENCOUNTER — Inpatient Hospital Stay (HOSPITAL_COMMUNITY): Payer: Medicaid Other

## 2016-06-19 DIAGNOSIS — F419 Anxiety disorder, unspecified: Secondary | ICD-10-CM

## 2016-06-19 DIAGNOSIS — R0789 Other chest pain: Secondary | ICD-10-CM

## 2016-06-19 DIAGNOSIS — N186 End stage renal disease: Secondary | ICD-10-CM

## 2016-06-19 DIAGNOSIS — Z992 Dependence on renal dialysis: Secondary | ICD-10-CM

## 2016-06-19 DIAGNOSIS — I509 Heart failure, unspecified: Secondary | ICD-10-CM

## 2016-06-19 LAB — HEMOGLOBIN A1C
Hgb A1c MFr Bld: 4.7 % — ABNORMAL LOW (ref 4.8–5.6)
MEAN PLASMA GLUCOSE: 88 mg/dL

## 2016-06-19 LAB — COMPREHENSIVE METABOLIC PANEL
ALT: 28 U/L (ref 17–63)
AST: 27 U/L (ref 15–41)
Albumin: 3.4 g/dL — ABNORMAL LOW (ref 3.5–5.0)
Alkaline Phosphatase: 139 U/L — ABNORMAL HIGH (ref 38–126)
Anion gap: 11 (ref 5–15)
BUN: 24 mg/dL — ABNORMAL HIGH (ref 6–20)
CHLORIDE: 100 mmol/L — AB (ref 101–111)
CO2: 28 mmol/L (ref 22–32)
CREATININE: 11.51 mg/dL — AB (ref 0.61–1.24)
Calcium: 8.1 mg/dL — ABNORMAL LOW (ref 8.9–10.3)
GFR, EST AFRICAN AMERICAN: 5 mL/min — AB (ref >60)
GFR, EST NON AFRICAN AMERICAN: 4 mL/min — AB (ref >60)
Glucose, Bld: 108 mg/dL — ABNORMAL HIGH (ref 65–99)
Potassium: 4.1 mmol/L (ref 3.5–5.1)
Sodium: 139 mmol/L (ref 135–145)
TOTAL PROTEIN: 6.8 g/dL (ref 6.5–8.1)
Total Bilirubin: 0.6 mg/dL (ref 0.3–1.2)

## 2016-06-19 LAB — ECHOCARDIOGRAM COMPLETE
CHL CUP TV REG PEAK VELOCITY: 228 cm/s
EWDT: 162 ms
FS: 27 % — AB (ref 28–44)
HEIGHTINCHES: 67 in
IVS/LV PW RATIO, ED: 1
LA ID, A-P, ES: 48 mm
LA diam index: 2.68 cm/m2
LA vol A4C: 63.8 ml
LA vol index: 41.1 mL/m2
LA vol: 73.6 mL
LDCA: 3.14 cm2
LEFT ATRIUM END SYS DIAM: 48 mm
LV PW d: 10 mm — AB (ref 0.6–1.1)
LV TDI E'LATERAL: 6.53
LVELAT: 6.53 cm/s
LVOT SV: 60 mL
LVOT VTI: 19.2 cm
LVOT diameter: 20 mm
LVOT peak grad rest: 4 mmHg
LVOTPV: 99 cm/s
MRPISAEROA: 0.04 cm2
MV Dec: 162
MV pk E vel: 2.4 m/s
RV TAPSE: 27.4 mm
TDI e' medial: 4.57
TR max vel: 228 cm/s
VTI: 193 cm
WEIGHTICAEL: 2398.6 [oz_av]

## 2016-06-19 LAB — TROPONIN I
TROPONIN I: 0.05 ng/mL — AB (ref <0.03)
TROPONIN I: 0.05 ng/mL — AB (ref <0.03)
TROPONIN I: 0.06 ng/mL — AB (ref <0.03)
Troponin I: 0.06 ng/mL (ref <0.03)

## 2016-06-19 LAB — GLUCOSE, CAPILLARY
GLUCOSE-CAPILLARY: 87 mg/dL (ref 65–99)
GLUCOSE-CAPILLARY: 115 mg/dL — AB (ref 65–99)
Glucose-Capillary: 93 mg/dL (ref 65–99)
Glucose-Capillary: 121 mg/dL — ABNORMAL HIGH (ref 65–99)

## 2016-06-19 LAB — CBC
HCT: 38.2 % — ABNORMAL LOW (ref 39.0–52.0)
Hemoglobin: 11.9 g/dL — ABNORMAL LOW (ref 13.0–17.0)
MCH: 29.8 pg (ref 26.0–34.0)
MCHC: 31.2 g/dL (ref 30.0–36.0)
MCV: 95.7 fL (ref 78.0–100.0)
PLATELETS: 100 10*3/uL — AB (ref 150–400)
RBC: 3.99 MIL/uL — AB (ref 4.22–5.81)
RDW: 15.3 % (ref 11.5–15.5)
WBC: 4.6 10*3/uL (ref 4.0–10.5)

## 2016-06-19 LAB — HEPATITIS B SURFACE ANTIBODY,QUALITATIVE: HEP B S AB: REACTIVE

## 2016-06-19 LAB — HEPATITIS B CORE ANTIBODY, TOTAL: Hep B Core Total Ab: NEGATIVE

## 2016-06-19 MED ORDER — METOPROLOL TARTRATE 25 MG PO TABS
25.0000 mg | ORAL_TABLET | Freq: Two times a day (BID) | ORAL | Status: DC
Start: 2016-06-19 — End: 2016-06-22

## 2016-06-19 MED ORDER — CALCIUM ACETATE (PHOS BINDER) 667 MG PO CAPS
1334.0000 mg | ORAL_CAPSULE | Freq: Three times a day (TID) | ORAL | Status: DC
  Administered 2016-06-19 – 2016-06-22 (×10): 1334 mg via ORAL
  Filled 2016-06-19 (×10): qty 2

## 2016-06-19 MED ORDER — ASPIRIN EC 81 MG PO TBEC
81.0000 mg | DELAYED_RELEASE_TABLET | Freq: Every day | ORAL | Status: DC
  Administered 2016-06-19 – 2016-06-22 (×4): 81 mg via ORAL
  Filled 2016-06-19 (×4): qty 1

## 2016-06-19 MED ORDER — ISOSORBIDE MONONITRATE ER 30 MG PO TB24: 15.0000 mg | ORAL_TABLET | Freq: Every day | ORAL | Status: DC

## 2016-06-19 MED ORDER — NITROGLYCERIN 0.4 MG SL SUBL
SUBLINGUAL_TABLET | SUBLINGUAL | Status: AC
  Filled 2016-06-19: qty 1

## 2016-06-19 MED ORDER — CALCITRIOL 0.5 MCG PO CAPS: 0.5000 ug | ORAL_CAPSULE | ORAL | Status: AC

## 2016-06-19 MED ORDER — DIPHENHYDRAMINE HCL 25 MG PO CAPS: 25.0000 mg | ORAL_CAPSULE | Freq: Four times a day (QID) | ORAL | Status: DC | PRN

## 2016-06-19 MED ORDER — NITROGLYCERIN 0.4 MG SL SUBL
0.4000 mg | SUBLINGUAL_TABLET | SUBLINGUAL | Status: DC | PRN
  Administered 2016-06-19 (×2): 0.4 mg via SUBLINGUAL

## 2016-06-19 MED ORDER — RENA-VITE PO TABS: 1.0000 | ORAL_TABLET | Freq: Every day | ORAL | Status: AC

## 2016-06-19 MED ORDER — ISOSORBIDE MONONITRATE ER 30 MG PO TB24
15.0000 mg | ORAL_TABLET | Freq: Every day | ORAL | Status: DC
  Administered 2016-06-19 – 2016-06-22 (×4): 15 mg via ORAL
  Filled 2016-06-19 (×4): qty 1

## 2016-06-19 MED ORDER — LISINOPRIL 20 MG PO TABS
20.0000 mg | ORAL_TABLET | Freq: Every day | ORAL | Status: DC
  Administered 2016-06-19 – 2016-06-22 (×4): 20 mg via ORAL
  Filled 2016-06-19 (×4): qty 1

## 2016-06-19 MED ORDER — CALCIUM ACETATE (PHOS BINDER) 667 MG PO CAPS: 667.0000 mg | ORAL_CAPSULE | ORAL | Status: DC | PRN

## 2016-06-19 MED ORDER — METOPROLOL TARTRATE 25 MG PO TABS
25.0000 mg | ORAL_TABLET | Freq: Two times a day (BID) | ORAL | Status: DC
  Administered 2016-06-19 – 2016-06-21 (×5): 25 mg via ORAL
  Filled 2016-06-19 (×5): qty 1

## 2016-06-19 MED ORDER — IPRATROPIUM-ALBUTEROL 0.5-2.5 (3) MG/3ML IN SOLN
3.0000 mL | Freq: Three times a day (TID) | RESPIRATORY_TRACT | Status: DC
  Administered 2016-06-19 (×2): 3 mL via RESPIRATORY_TRACT
  Filled 2016-06-19 (×3): qty 3
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
## 2016-06-19 NOTE — Progress Notes (Signed)
Dr. Allena Katz text paged to let him know the most recent troponin level and that the patient is having "anxiety" for which he normally takes 5mg  Valium PRN (usually one does every 2 days).

## 2016-06-19 NOTE — Clinical Social Work Note (Signed)
CSW acknowledges consult for "assistance to arrange dialysis/follow up care with nephrology." [facility] notified.  CSW signing off. Consult if any social work needs arise.  Charlynn Court, CSW [phone]

## 2016-06-19 NOTE — Progress Notes (Signed)
Patient's left chest pain is now a 2/10, patient refusing additional nitroglycerin SL.

## 2016-06-19 NOTE — Progress Notes (Signed)
[facility] Progress Note  Patient: Jeffery Riley VWP:[id number]   PCP: Roxanne Mins, PA-C DOB: 1966-04-20   DOA: 06/18/2016   DOS: 06/19/2016   Date of Service: the patient was seen and examined on 06/19/2016  Subjective: Patient denies any active chest pain. Complains of heavy chest pressure yesterday on admission. Because of mild shortness of breath. Denies any prior cardiac workup. Also mentions has fallen many times. Denies any focal deficit. No dizziness or lightheadedness of the time. Nutrition: Tolerating oral diet  Brief hospital course: Pt. with PMH of ESRD on HD, HTN; admitted on 06/18/2016, with complaint of chest pressure as well as shortness of breath, was found to have volume overload from acute on chronic diastolic dysfunction associated with missing hemodialysis treatment. Also complained of chest pain. Status post one session of hemodialysis on 06/18/2016 with resolution of chest pain and shortness of breath, still remains hypoxic 88% on room air. Currently further plan is continue further cardiac workup.  Assessment and Plan: 1. ESRD (end stage renal disease) on dialysis (HCC)  Volume overload from Suspected acute on chronic diastolic dysfunction with CHF.  Status post HD on 06/18/2016. Close to his dry weight Chest pain and shortness of breath is resolved. Nephrology following. Clip process initiated close to [place]. Will follow for recommendation.  2. Chest pressure. Mention that he had an elephant sitting on his chest yesterday. Currently chest pain-free. Troponins are not elevated in fashion of ACS but patient has prolonged QTC on EKG. Cardiology on chest x-ray. Recent CT PE protocol was negative for pulmonary embolism in [place] [facility] Probable atelectasis on the basis causing hypoxia. We will use incentive spirometry. Echocardiogram will be checked, cardiology consulted. Resume home aspirin. Continue Lopressor 25 mg twice a day.  3. Essential  hypertension. Blood pressure mildly elevated. Resuming home amlodipine as well as lisinopril. Also continuing newly initiated twice a day Lopressor, dose adjusted to 25 mg from 50 mg.  4. Thrombocytopenia. Etiology is unclear. Continue to closely monitor.  5. Dyspnea.  patient mentions he has chronic dyspnea and uses Combivent on a scheduled basis at home. Here patient is placed on duo nebs. Get echocardiogram. Recent CT PE protocol was unremarkable.  6. History of diabetes mellitus. Hemoglobin A1c is pending. Blood sugars are well controlled. We will discontinue sliding scale insulin, monitor with CBG.  Pain management: When necessary Tylenol Activity: Appears independent Bowel regimen: last BM 06/17/2016 Diet: Renal diet DVT Prophylaxis: subcutaneous Heparin  Advance goals of care discussion: Full code  Family Communication: no family was present at bedside, at the time of interview.   Disposition:  Discharge to home. Expected discharge date: 06/20/2016, pending cardiology consult as well as clipping process  Consultants: Nephrology, cardiology Procedures: Hemodialysis, echocardiogram pending  Antibiotics: Anti-infectives    None        Intake/Output Summary (Last 24 hours) at 06/19/16 0815 Last data filed at 06/18/16 2144  Gross per 24 hour  Intake      0 ml  Output   1517 ml  Net  -1517 ml   Filed Weights   06/18/16 1536 06/18/16 1800 06/19/16 0639  Weight: 70.1 kg (154 lb 8.7 oz) 68.6 kg (151 lb 3.8 oz) 68 kg (149 lb 14.6 oz)    Objective: Physical Exam: Filed Vitals:   06/18/16 2144 06/19/16 0336 06/19/16 0639 06/19/16 0700  BP: 163/92 178/94  163/96  Pulse: 84 93  81  Temp: 97.9 F (36.6 C) 98.2 F (36.8 C)  98.1 F (36.7  C)  TempSrc: Oral Oral  Oral  Resp: Height:      Weight:   68 kg (149 lb 14.6 oz)   SpO2: 98% 93%  93%    General: Alert, Awake and Oriented to Time, Place and Person. Appear in mild distress Eyes: PERRL,  Conjunctiva normal ENT: Oral Mucosa clear moist. Neck: no JVD, no Abnormal Mass Or lumps Cardiovascular: S1 and S2 Present, aortic systolic Murmur, Respiratory: Bilateral Air entry equal and Decreased, Clear to Auscultation, no Crackles, no wheezes Abdomen: Bowel Sound present, Soft and no tenderness Skin: no redness, no Rash  Extremities: no Pedal edema, no calf tenderness Neurologic: Grossly no focal neuro deficit. Bilaterally Equal motor strength  Data Reviewed: CBC:  Recent Labs Lab 06/18/16 1115 06/19/16 0506  WBC 4.0 4.6  NEUTROABS 2.4  --   HGB 11.6* 11.9*  HCT 34.9* 38.2*  MCV 92.6 95.7  PLT 95* 100*   Basic Metabolic Panel:  Recent Labs Lab 06/18/16 1115 06/19/16 0506  NA 139 139  K 3.8 4.1  CL 97* 100*  CO2 24 28  GLUCOSE 109* 108*  BUN 60* 24*  CREATININE 21.06* 11.51*  CALCIUM 8.0* 8.1*    Liver Function Tests:  Recent Labs Lab 06/18/16 1844 06/19/16 0506  AST  --  27  ALT 30 28  ALKPHOS  --  139*  BILITOT  --  0.6  PROT  --  6.8  ALBUMIN  --  3.4*   No results for input(s): LIPASE, AMYLASE in the last 168 hours. No results for input(s): AMMONIA in the last 168 hours. Coagulation Profile: No results for input(s): INR, PROTIME in the last 168 hours. Cardiac Enzymes:  Recent Labs Lab 06/18/16 1115 06/18/16 1649 06/19/16 0005 06/19/16 0506  TROPONINI 0.06* 0.06* 0.06* 0.05*   BNP (last 3 results) No results for input(s): PROBNP in the last 8760 hours.  CBG:  Recent Labs Lab 06/18/16 1656  GLUCAP 91    Studies: Dg Chest 2 View  06/18/2016  CLINICAL DATA:  Shortness of breath, chest pain for 3 days EXAM: CHEST  2 VIEW COMPARISON:  6/27/ 17 FINDINGS: Cardiomegaly again noted. No infiltrate or pleural effusion. No pulmonary edema. Mild degenerative changes thoracic spine again noted. IMPRESSION: No active cardiopulmonary disease.  Cardiomegaly again noted. Electronically Signed   By: Natasha Mead M.D.   On: 06/18/2016 11:26      Scheduled Meds: . amLODipine  5 mg Oral Daily  . aspirin EC  81 mg Oral Daily  . [START ON 06/20/2016] calcitRIOL  0.5 mcg Oral Q T,Th,Sa-HD  . calcium acetate  1,334 mg Oral TID WC  . cinacalcet  60 mg Oral Daily  . ipratropium-albuterol  3 mL Inhalation TID  . lisinopril  20 mg Oral Daily  . metoprolol  25 mg Oral BID  . multivitamin  1 tablet Oral QHS  . sodium chloride flush  3 mL Intravenous Q12H   Continuous Infusions:  PRN Meds: acetaminophen **OR** acetaminophen, calcium acetate, hydrALAZINE, ipratropium-albuterol, morphine injection, ondansetron **OR** ondansetron (ZOFRAN) IV  Time spent: 30 minutes  Author: Lynden Oxford, MD [facility] Pager: [phone] 06/19/2016 8:15 AM  If 7PM-7AM, please contact night-coverage at [email or web address], password [facility]

## 2016-06-19 NOTE — Progress Notes (Signed)
Addendum: C/o Chest pain. Hold in step down. Ad nitro PRN, stat EKG and troponin.   [place] 8:45 AM 06/19/2016

## 2016-06-19 NOTE — Progress Notes (Signed)
  [facility] Progress Note   Subjective: feeling good, had some chest cramping on HD last night  Filed Vitals:   06/19/16 0815 06/19/16 0841 06/19/16 0904 06/19/16 1132  BP:  165/102 162/96 164/97  Pulse:   84 78  Temp:    98.2 F (36.8 C)  TempSrc:    Oral  Resp:   14 18  Height:      Weight:      SpO2: 94%  95% 95%    Inpatient medications: . amLODipine  5 mg Oral Daily  . aspirin EC  81 mg Oral Daily  . [START ON 06/20/2016] calcitRIOL  0.5 mcg Oral Q T,Th,Sa-HD  . calcium acetate  1,334 mg Oral TID WC  . cinacalcet  60 mg Oral Daily  . ipratropium-albuterol  3 mL Inhalation TID  . isosorbide mononitrate  15 mg Oral Daily  . lisinopril  20 mg Oral Daily  . metoprolol  25 mg Oral BID  . multivitamin  1 tablet Oral QHS  . sodium chloride flush  3 mL Intravenous Q12H     acetaminophen **OR** acetaminophen, calcium acetate, hydrALAZINE, ipratropium-albuterol, morphine injection, nitroGLYCERIN, ondansetron **OR** ondansetron (ZOFRAN) IV  Exam: Alert, no distress No jvd Chest clear bilat RRR soft SEM no rg Abd soft ntnd no ascites RUA AVG+bruit No LE edema      Assessment: 1 ESRD missed HD in transition moving from [place]/ mild uremia/ azotemia - resolving, had HD last night 2 HTN cont meds 3 Anemia no need for esa 4 MBD  Plan - ok for DC home today, pt is to go to [facility] ([address], [place], [name], [address]) at 6:30am for dialysis tomorrow / Sat   Vinson Moselle MD [facility] pager [phone]    cell [phone] 06/19/2016, 1:34 PM    Recent Labs Lab 06/18/16 1115 06/19/16 0506  NA 139 139  K 3.8 4.1  CL 97* 100*  CO2 24 28  GLUCOSE 109* 108*  BUN 60* 24*  CREATININE 21.06* 11.51*  CALCIUM 8.0* 8.1*    Recent Labs Lab 06/18/16 1844 06/19/16 0506  AST  --  27  ALT 30 28  ALKPHOS  --  139*  BILITOT  --  0.6  PROT  --  6.8  ALBUMIN  --  3.4*    Recent Labs Lab 06/18/16 1115 06/19/16 0506  WBC 4.0 4.6   NEUTROABS 2.4  --   HGB 11.6* 11.9*  HCT 34.9* 38.2*  MCV 92.6 95.7  PLT 95* 100*   Iron/TIBC/Ferritin/ %Sat No results found for: IRON, TIBC, FERRITIN, IRONPCTSAT

## 2016-06-19 NOTE — Progress Notes (Signed)
Patient states the left chest pain is "completely gone"; after one SL nitro tab.

## 2016-06-19 NOTE — Progress Notes (Signed)
Contacted by IM to review echo. D/w Dr. Excell Seltzer. LVEF down, prior not known. Given this finding, troponin elevation, cardiac risk factors will undertake nuclear stress test in AM. I spoke with IM and the patient to make them aware. Dayna Dunn PA-C

## 2016-06-19 NOTE — Progress Notes (Signed)
  Echocardiogram 2D Echocardiogram has been performed.  Arvil Chaco 06/19/2016, 3:13 PM

## 2016-06-19 NOTE — Progress Notes (Signed)
Patient complaining of left sided chest pain, rated 9/10, non-radiating.  States it started a short while ago.  Dr. Allena Katz text paged to let him know.  Patient also states he does "not want pain medicine".

## 2016-06-20 ENCOUNTER — Inpatient Hospital Stay (HOSPITAL_COMMUNITY): Payer: Medicaid Other

## 2016-06-20 DIAGNOSIS — I160 Hypertensive urgency: Secondary | ICD-10-CM | POA: Insufficient documentation

## 2016-06-20 DIAGNOSIS — E119 Type 2 diabetes mellitus without complications: Secondary | ICD-10-CM

## 2016-06-20 DIAGNOSIS — R079 Chest pain, unspecified: Secondary | ICD-10-CM

## 2016-06-20 DIAGNOSIS — D696 Thrombocytopenia, unspecified: Secondary | ICD-10-CM

## 2016-06-20 LAB — GLUCOSE, CAPILLARY
GLUCOSE-CAPILLARY: 92 mg/dL (ref 65–99)
GLUCOSE-CAPILLARY: 110 mg/dL — AB (ref 65–99)
GLUCOSE-CAPILLARY: 95 mg/dL (ref 65–99)
Glucose-Capillary: 129 mg/dL — ABNORMAL HIGH (ref 65–99)

## 2016-06-20 LAB — RENAL FUNCTION PANEL
ANION GAP: 13 (ref 5–15)
Albumin: 3.4 g/dL — ABNORMAL LOW (ref 3.5–5.0)
BUN: 41 mg/dL — ABNORMAL HIGH (ref 6–20)
CHLORIDE: 100 mmol/L — AB (ref 101–111)
CO2: 25 mmol/L (ref 22–32)
Calcium: 8 mg/dL — ABNORMAL LOW (ref 8.9–10.3)
Creatinine, Ser: 15.36 mg/dL — ABNORMAL HIGH (ref 0.61–1.24)
GFR calc non Af Amer: 3 mL/min — ABNORMAL LOW (ref >60)
GFR, EST AFRICAN AMERICAN: 4 mL/min — AB (ref >60)
GLUCOSE: 95 mg/dL (ref 65–99)
Phosphorus: 3.8 mg/dL (ref 2.5–4.6)
Potassium: 4.6 mmol/L (ref 3.5–5.1)
SODIUM: 138 mmol/L (ref 135–145)

## 2016-06-20 LAB — NM MYOCAR MULTI W/SPECT W/WALL MOTION / EF
CHL CUP MPHR: 169 {beats}/min
CSEPHR: 63 %
Estimated workload: 1 METS
Exercise duration (min): 0 min
Exercise duration (sec): 0 s
Peak HR: 108 {beats}/min
RPE: 0
Rest HR: 90 {beats}/min

## 2016-06-20 LAB — CBC
HCT: 39.2 % (ref 39.0–52.0)
HEMOGLOBIN: 12.7 g/dL — AB (ref 13.0–17.0)
MCH: 29.9 pg (ref 26.0–34.0)
MCHC: 32.4 g/dL (ref 30.0–36.0)
MCV: 92.2 fL (ref 78.0–100.0)
Platelets: 98 10*3/uL — ABNORMAL LOW (ref 150–400)
RBC: 4.25 MIL/uL (ref 4.22–5.81)
RDW: 15.1 % (ref 11.5–15.5)
WBC: 5 10*3/uL (ref 4.0–10.5)

## 2016-06-20 MED ORDER — SODIUM CHLORIDE 0.9 % IJ SOLN
80.0000 mg | INTRAVENOUS | Status: AC
  Administered 2016-06-20: 80 mg via INTRAVENOUS

## 2016-06-20 MED ORDER — IPRATROPIUM-ALBUTEROL 0.5-2.5 (3) MG/3ML IN SOLN
3.0000 mL | Freq: Three times a day (TID) | RESPIRATORY_TRACT | Status: DC
  Administered 2016-06-20 – 2016-06-21 (×3): 3 mL via RESPIRATORY_TRACT
  Filled 2016-06-20 (×4): qty 3

## 2016-06-20 MED ORDER — SODIUM CHLORIDE 0.9 % IV SOLN: 100.0000 mL | INTRAVENOUS | Status: DC | PRN

## 2016-06-20 MED ORDER — TECHNETIUM TC 99M TETROFOSMIN IV KIT
10.0000 | PACK | Freq: Once | INTRAVENOUS | Status: AC | PRN
  Administered 2016-06-20: 10 via INTRAVENOUS

## 2016-06-20 MED ORDER — TECHNETIUM TC 99M TETROFOSMIN IV KIT
30.0000 | PACK | Freq: Once | INTRAVENOUS | Status: AC | PRN
  Administered 2016-06-20: 30 via INTRAVENOUS

## 2016-06-20 MED ORDER — LIDOCAINE HCL (PF) 1 % IJ SOLN: 5.0000 mL | INTRAMUSCULAR | Status: DC | PRN

## 2016-06-20 MED ORDER — REGADENOSON 0.4 MG/5ML IV SOLN
INTRAVENOUS | Status: AC
  Administered 2016-06-20: 12:00:00
  Filled 2016-06-20: qty 5

## 2016-06-20 MED ORDER — PENTAFLUOROPROP-TETRAFLUOROETH EX AERO: 1.0000 "application " | INHALATION_SPRAY | CUTANEOUS | Status: DC | PRN

## 2016-06-20 MED ORDER — ALTEPLASE 2 MG IJ SOLR: 2.0000 mg | Freq: Once | INTRAMUSCULAR | Status: DC | PRN

## 2016-06-20 MED ORDER — ATORVASTATIN CALCIUM 80 MG PO TABS
80.0000 mg | ORAL_TABLET | Freq: Every day | ORAL | Status: DC
  Administered 2016-06-20 – 2016-06-22 (×3): 80 mg via ORAL
  Filled 2016-06-20 (×3): qty 1

## 2016-06-20 MED ORDER — CALCITRIOL 0.5 MCG PO CAPS
ORAL_CAPSULE | ORAL | Status: AC
  Filled 2016-06-20: qty 1

## 2016-06-20 MED ORDER — REGADENOSON 0.4 MG/5ML IV SOLN
0.4000 mg | Freq: Once | INTRAVENOUS | Status: AC
  Administered 2016-06-20: 0.4 mg via INTRAVENOUS
  Filled 2016-06-20: qty 5

## 2016-06-20 MED ORDER — HEPARIN SODIUM (PORCINE) 1000 UNIT/ML DIALYSIS
1000.0000 [IU] | INTRAMUSCULAR | Status: DC | PRN
  Filled 2016-06-20: qty 1

## 2016-06-20 MED ORDER — LIDOCAINE-PRILOCAINE 2.5-2.5 % EX CREA
1.0000 "application " | TOPICAL_CREAM | CUTANEOUS | Status: DC | PRN
  Filled 2016-06-20: qty 5

## 2016-06-20 MED ORDER — HEPARIN SODIUM (PORCINE) 1000 UNIT/ML DIALYSIS
3500.0000 [IU] | INTRAMUSCULAR | Status: DC | PRN
  Filled 2016-06-20: qty 4

## 2016-06-20 MED ORDER — HEPARIN SODIUM (PORCINE) 5000 UNIT/ML IJ SOLN
5000.0000 [IU] | Freq: Three times a day (TID) | INTRAMUSCULAR | Status: DC
  Administered 2016-06-20 – 2016-06-22 (×5): 5000 [IU] via SUBCUTANEOUS
  Filled 2016-06-20 (×5): qty 1

## 2016-06-20 NOTE — Progress Notes (Signed)
lexiscan myoview completed Nuc results to follow.

## 2016-06-20 NOTE — Progress Notes (Signed)
SUBJECTIVE: The patient is doing well today.  Chest pain is currently resolved.  SOB continues to keep him awake.   Marland Kitchen amLODipine  5 mg Oral Daily  . aspirin EC  81 mg Oral Daily  . calcitRIOL  0.5 mcg Oral Q T,Th,Sa-HD  . calcium acetate  1,334 mg Oral TID WC  . cinacalcet  60 mg Oral Daily  . ipratropium-albuterol  3 mL Inhalation TID  . isosorbide mononitrate  15 mg Oral Daily  . lisinopril  20 mg Oral Daily  . metoprolol  25 mg Oral BID  . multivitamin  1 tablet Oral QHS  . sodium chloride flush  3 mL Intravenous Q12H      OBJECTIVE: Physical Exam: Filed Vitals:   06/19/16 2335 06/20/16 0320 06/20/16 0800 06/20/16 0802  BP: 161/92 173/95 181/96   Pulse: 86 87 87   Temp: 98.4 F (36.9 C) 98.4 F (36.9 C) 97.8 F (36.6 C)   TempSrc: Oral Oral    Resp: 18 17 18    Height:      Weight:  161 lb 2.5 oz (73.1 kg)    SpO2: 94% 97% 97% 96%    Intake/Output Summary (Last 24 hours) at 06/20/16 0935 Last data filed at 06/19/16 2020  Gross per 24 hour  Intake    360 ml  Output      0 ml  Net    360 ml    Telemetry reveals sinus rhythm  GEN- The patient is well appearing, alert and oriented x 3 today.   Head- normocephalic, atraumatic Eyes-  Sclera clear, conjunctiva pink Ears- hearing intact Oropharynx- clear Neck- supple,  Lungs- Clear to ausculation bilaterally, normal work of breathing Heart- Regular rate and rhythm, no murmurs, rubs or gallops, PMI not laterally displaced GI- soft, NT, ND, + BS Extremities- no clubbing, cyanosis, or edema Skin- no rash or lesion Psych- euthymic mood, full affect Neuro- strength and sensation are intact  LABS: Basic Metabolic Panel:  Recent Labs  95/09/32 1115 06/19/16 0506  NA 139 139  K 3.8 4.1  CL 97* 100*  CO2 24 28  GLUCOSE 109* 108*  BUN 60* 24*  CREATININE 21.06* 11.51*  CALCIUM 8.0* 8.1*   Liver Function Tests:  Recent Labs  06/18/16 1844 06/19/16 0506  AST  --  27  ALT 30 28  ALKPHOS  --  139*    BILITOT  --  0.6  PROT  --  6.8  ALBUMIN  --  3.4*   No results for input(s): LIPASE, AMYLASE in the last 72 hours. CBC:  Recent Labs  06/18/16 1115 06/19/16 0506  WBC 4.0 4.6  NEUTROABS 2.4  --   HGB 11.6* 11.9*  HCT 34.9* 38.2*  MCV 92.6 95.7  PLT 95* 100*   Cardiac Enzymes:  Recent Labs  06/19/16 0506 06/19/16 0948 06/19/16 2112  TROPONINI 0.05* 0.06* 0.05*   BNP: Invalid input(s): POCBNP D-Dimer: No results for input(s): DDIMER in the last 72 hours. Hemoglobin A1C:  Recent Labs  06/18/16 1649  HGBA1C 4.7*   Echo reviewed  ASSESSMENT AND PLAN:  Principal Problem:   ESRD (end stage renal disease) on dialysis [facility]) Active Problems:   Chest pain   Hypertension   Diabetes mellitus without complication (HCC)   Anxiety   Thrombocytopenia (HCC)  ASSESSMENT AND PLAN:  1. Chest Pain Both typical and atypical features myoview today No significant effusion on echo, EF mildly depressed  2. SOB multifactoral myoview today Volume to be managed  by nephrology  3. Hypertensive urgency Will follow closely with medicine titration as BP allows with HD  4. Renal faiulre Per nephrology  Hillis Range MD, [facility] 06/20/2016 9:37 AM     Hillis Range, MD 06/20/2016 9:35 AM

## 2016-06-20 NOTE — Progress Notes (Signed)
  [facility] Progress Note   Subjective: was kept for further cardiac w/u  Filed Vitals:   06/20/16 0802 06/20/16 1158 06/20/16 1206 06/20/16 1208  BP:  185/101 193/104 196/100  Pulse:  88 104 107  Temp:      TempSrc:      Resp:      Height:      Weight:      SpO2: 96%       Inpatient medications: . regadenoson      . amLODipine  5 mg Oral Daily  . aspirin EC  81 mg Oral Daily  . calcitRIOL  0.5 mcg Oral Q T,Th,Sa-HD  . calcium acetate  1,334 mg Oral TID WC  . cinacalcet  60 mg Oral Daily  . ipratropium-albuterol  3 mL Inhalation TID  . isosorbide mononitrate  15 mg Oral Daily  . lisinopril  20 mg Oral Daily  . metoprolol  25 mg Oral BID  . multivitamin  1 tablet Oral QHS  . sodium chloride flush  3 mL Intravenous Q12H     acetaminophen **OR** acetaminophen, calcium acetate, hydrALAZINE, ipratropium-albuterol, morphine injection, nitroGLYCERIN, ondansetron **OR** ondansetron (ZOFRAN) IV  Exam: Alert, no distress No jvd Chest clear bilat RRR soft SEM no rg Abd soft ntnd no ascites RUA AVG+bruit No LE edema      Assessment: 1 ESRD just moved here from [place]; set up for HD at [facility] [address]) on TTS 2 HTN cont meds 3 Anemia no need for esa 4 MBD 5 Chest pain - for Myoview today per cardiology 6 Dispo - ok for dc from renal standpoint  Plan - no new suggestions   Vinson Moselle MD [facility] pager [phone]    cell [phone] 06/20/2016, 12:33 PM    Recent Labs Lab 06/18/16 1115 06/19/16 0506  NA 139 139  K 3.8 4.1  CL 97* 100*  CO2 24 28  GLUCOSE 109* 108*  BUN 60* 24*  CREATININE 21.06* 11.51*  CALCIUM 8.0* 8.1*    Recent Labs Lab 06/18/16 1844 06/19/16 0506  AST  --  27  ALT 30 28  ALKPHOS  --  139*  BILITOT  --  0.6  PROT  --  6.8  ALBUMIN  --  3.4*    Recent Labs Lab 06/18/16 1115 06/19/16 0506  WBC 4.0 4.6  NEUTROABS 2.4  --   HGB 11.6* 11.9*  HCT 34.9* 38.2*  MCV 92.6 95.7  PLT 95* 100*    Iron/TIBC/Ferritin/ %Sat No results found for: IRON, TIBC, FERRITIN, IRONPCTSAT

## 2016-06-20 NOTE — Progress Notes (Signed)
PROGRESS NOTE  Jeffery Riley  ZOX:[id number] DOB: 1966/01/03 DOA: 06/18/2016 PCP: Roxanne Mins, PA-C  Brief Narrative:   Pt. with PMH of ESRD on HD, HTN; admitted on 06/18/2016, with complaint of chest pressure as well as shortness of breath.  Despite being on dialysis, the patient is very active and runs daily. He also goes to the gym on a regular basis. He has noticed that over the last 2 weeks he has had difficulty with shortness of breath even with walking. This is a dramatic change from his baseline of being able to run several miles. On the morning of admission, he developed some heaviness in of the chest and some increased shortness of breath. He is normally very compliant with his hemodialysis, however he lives in [place] and is traveling through the state and he missed some hemodialysis. He tried to routinely show up to the emergency department for hemodialysis at [facility], explaining to them that he was on vacation, however they refuse to dialyze him on his normal day because it was not emergent.  He re-presented to the hospital when his chest pressure and shortness of breath worsened.    Assessment & Plan:   Principal Problem:   ESRD (end stage renal disease) on dialysis [facility]) Active Problems:   Chest pain   Hypertension   Diabetes mellitus without complication (HCC)   Anxiety   Thrombocytopenia (HCC)   Hypertensive urgency  Chest pressure, likely due to recent MI. He continues to have some mild chest pressure but markedly improved from the date of admission. His troponins were mildly elevated but flat. His echocardiogram was concerning for a decreased ejection fraction. Nuclear medicine stress test today demonstrated no reversible ischemia, however he had a large inferior wall myocardial infarction. He had severe inferior and apical wall hypokinesis and an estimated LVEF of 43%. His stress test was deemed intermediate risk. -  Appreciate cardiology assistance  - Continue  aspirin -  Start high dose statin   -  Continue beta blocker and ACE inhibitor   End-stage renal disease, received hemodialysis on 7/1 -  Appreciate nephrology assistance  Essential hypertension, has been difficult to control for many years -  Continue amlodipine, lisinopril, metoprolol  Diabetes mellitus type 2, diet controlled, hemoglobin A1c 4.7   Mild anemia of chronic kidney disease and stable mild thrombocytopenia -  Repeat CBC in AM  DVT prophylaxis:  Heparin Code Status:  Full Family Communication:  Patient alone Disposition Plan:  Possibly home tomorrow if the patient and cardiology decided that he is not going to undergo go cardiac catheterization tomorrow or Monday. If he decides to undergo cardiac catheterization, would anticipate discharge either on Monday or Tuesday depending on the timing of procedure. Will likely not need any home health services or equipment.    Consultants:  Cardiology Nephrology  Procedures: Hemodialysis Echocardiogram: Left ventricle: The cavity size was normal. Wall thickness was  increased in a pattern of mild LVH. Systolic function was mildly  reduced. The estimated ejection fraction was in the range of 45%  to 50%. Features are consistent with a pseudonormal left  ventricular filling pattern, with concomitant abnormal relaxation  and increased filling pressure (grade 2 diastolic dysfunction). - Mitral valve: There was moderate regurgitation. - Left atrium: The atrium was mildly to moderately dilated. - Right ventricle: The cavity size was mildly dilated. Systolic  function was mildly reduced. - Right atrium: The atrium was mildly dilated. - Pericardium, extracardiac: A trivial pericardial effusion was  identified.  nuclear medicine stress test:  1. No reversible ischemia demonstrated. Large inferior wall myocardial infarction.  2. Severe inferior and apical wall hypokinesis, with moderate to severe left ventricular  dilatation.  3. Left ventricular ejection fraction 43%  4. Non invasive risk stratification*: Intermediate  Subjective: Has a mild chest pressure that is ongoing, but markedly improved from prior. Rates his discomfort as a 2 out of 10. Mild dyspnea. Denies diaphoresis, nausea.  ive: Filed Vitals:   06/20/16 1513 06/20/16 1750 06/20/16 1800 06/20/16 1805  BP: 168/96 168/94 159/90 164/87  Pulse: 84 88 86 85  Temp: 98.1 F (36.7 C) 97 F (36.1 C)    TempSrc: Oral Oral    Resp: Height:      Weight:  67 kg (147 lb 11.3 oz)    SpO2: 97% 97%      Intake/Output Summary (Last 24 hours) at 06/20/16 1836 Last data filed at 06/20/16 1654  Gross per 24 hour  Intake    460 ml  Output      0 ml  Net    460 ml   Filed Weights   06/19/16 0639 06/20/16 0320 06/20/16 1750  Weight: 68 kg (149 lb 14.6 oz) 73.1 kg (161 lb 2.5 oz) 67 kg (147 lb 11.3 oz)    Examination:  General exam:  Adult Male .  No acute distress.  HEENT:  NCAT, MMM Respiratory system: Clear to auscultation bilaterally Cardiovascular system: Regular rate and rhythm, normal S1/S2. No murmurs, rubs, gallops or clicks.  Warm extremities Gastrointestinal system: Normal active bowel sounds, soft, nondistended, nontender. MSK:  Normal tone and bulk, no lower extremity edema, right arm fistula with good bruit and thrill  Neuro:  Grossly intact    Data Reviewed: I have personally reviewed following labs and imaging studies  CBC:  Recent Labs Lab 06/18/16 1115 06/19/16 0506  WBC 4.0 4.6  NEUTROABS 2.4  --   HGB 11.6* 11.9*  HCT 34.9* 38.2*  MCV 92.6 95.7  PLT 95* 100*   Basic Metabolic Panel:  Recent Labs Lab 06/18/16 1115 06/19/16 0506  NA 139 139  K 3.8 4.1  CL 97* 100*  CO2 24 28  GLUCOSE 109* 108*  BUN 60* 24*  CREATININE 21.06* 11.51*  CALCIUM 8.0* 8.1*   GFR: Estimated Creatinine Clearance: 7.1 mL/min (by C-G formula based on Cr of 11.51). Liver Function Tests:  Recent  Labs Lab 06/18/16 1844 06/19/16 0506  AST  --  27  ALT 30 28  ALKPHOS  --  139*  BILITOT  --  0.6  PROT  --  6.8  ALBUMIN  --  3.4*   No results for input(s): LIPASE, AMYLASE in the last 168 hours. No results for input(s): AMMONIA in the last 168 hours. Coagulation Profile: No results for input(s): INR, PROTIME in the last 168 hours. Cardiac Enzymes:  Recent Labs Lab 06/18/16 1649 06/19/16 0005 06/19/16 0506 06/19/16 0948 06/19/16 2112  TROPONINI 0.06* 0.06* 0.05* 0.06* 0.05*   BNP (last 3 results) No results for input(s): PROBNP in the last 8760 hours. HbA1C:  Recent Labs  06/18/16 1649  HGBA1C 4.7*   CBG:  Recent Labs Lab 06/19/16 1635 06/19/16 2127 06/20/16 0835 06/20/16 1258 06/20/16 1703  GLUCAP 115* 121* 92 129* 110*   Lipid Profile: No results for input(s): CHOL, HDL, LDLCALC, TRIG, CHOLHDL, LDLDIRECT in the last 72 hours. Thyroid Function Tests: No results for input(s): TSH, T4TOTAL, FREET4, T3FREE, THYROIDAB in the last 72  hours. Anemia Panel: No results for input(s): VITAMINB12, FOLATE, FERRITIN, TIBC, IRON, RETICCTPCT in the last 72 hours. Urine analysis: No results found for: COLORURINE, APPEARANCEUR, LABSPEC, PHURINE, GLUCOSEU, HGBUR, BILIRUBINUR, KETONESUR, PROTEINUR, UROBILINOGEN, NITRITE, LEUKOCYTESUR Sepsis Labs: @LABRCNTIP (procalcitonin:4,lacticidven:4)  ) Recent Results (from the past 240 hour(s))  MRSA PCR Screening     Status: None   Collection Time: 06/18/16  4:01 PM  Result Value Ref Range Status   MRSA by PCR NEGATIVE NEGATIVE Final    Comment:        The GeneXpert MRSA Assay (FDA approved for NASAL specimens only), is one component of a comprehensive MRSA colonization surveillance program. It is not intended to diagnose MRSA infection nor to guide or monitor treatment for MRSA infections.       Radiology Studies: Nm Myocar Multi W/spect W/wall Motion / Ef  06/20/2016  CLINICAL DATA:  Chest pain.  Diabetes and  hypertension. EXAM: MYOCARDIAL IMAGING WITH SPECT (REST AND PHARMACOLOGIC-STRESS) GATED LEFT VENTRICULAR WALL MOTION STUDY LEFT VENTRICULAR EJECTION FRACTION TECHNIQUE: Standard myocardial SPECT imaging was performed after resting intravenous injection of 10 mCi Tc-47m tetrofosmin. Subsequently, intravenous infusion of Lexiscan was performed under the supervision of the Cardiology staff. At peak effect of the drug, 30 mCi Tc-63m tetrofosmin was injected intravenously and standard myocardial SPECT imaging was performed. Quantitative gated imaging was also performed to evaluate left ventricular wall motion, and estimate left ventricular ejection fraction. COMPARISON:  None. FINDINGS: Perfusion: Large myocardial perfusion defect throughout the inferior wall of the left ventricle on stress imaging, which shows no evidence of reversibility on resting images. This is consistent with myocardial infarction. No reversible myocardial perfusion defects are seen to suggest reversible ischemia. Wall Motion: Severe inferior and apical wall hypokinesis. Moderate to severe left ventricular dilatation. Left Ventricular Ejection Fraction: 43 % End diastolic volume 237 ml End systolic volume 134 ml IMPRESSION: 1. No reversible ischemia demonstrated. Large inferior wall myocardial infarction. 2. Severe inferior and apical wall hypokinesis, with moderate to severe left ventricular dilatation. 3. Left ventricular ejection fraction 43% 4. Non invasive risk stratification*: Intermediate *2012 Appropriate Use Criteria for Coronary Revascularization Focused Update: J Am Coll Cardiol. 2012;59(9):857-881. http://content.dementiazones.com.aspx?articleid=[email or web address] Electronically Signed   By: Myles Rosenthal M.D.   On: 06/20/2016 13:57     Scheduled Meds: . amLODipine  5 mg Oral Daily  . aspirin EC  81 mg Oral Daily  . atorvastatin  80 mg Oral q1800  . calcitRIOL  0.5 mcg Oral Q T,Th,Sa-HD  . calcium acetate  1,334 mg Oral TID WC  .  cinacalcet  60 mg Oral Daily  . ipratropium-albuterol  3 mL Inhalation TID  . isosorbide mononitrate  15 mg Oral Daily  . lisinopril  20 mg Oral Daily  . metoprolol  25 mg Oral BID  . multivitamin  1 tablet Oral QHS  . sodium chloride flush  3 mL Intravenous Q12H   Continuous Infusions:    LOS: 2 days    Time spent: 30 min    Renae Fickle, MD [facility] Pager [phone]  If 7PM-7AM, please contact night-coverage [email or web address] Password [facility] 06/20/2016, 6:36 PM

## 2016-06-21 DIAGNOSIS — R079 Chest pain, unspecified: Secondary | ICD-10-CM

## 2016-06-21 DIAGNOSIS — I10 Essential (primary) hypertension: Secondary | ICD-10-CM

## 2016-06-21 LAB — GLUCOSE, CAPILLARY
Glucose-Capillary: 78 mg/dL (ref 65–99)
Glucose-Capillary: 102 mg/dL — ABNORMAL HIGH (ref 65–99)

## 2016-06-21 LAB — CBC
HCT: 39.4 % (ref 39.0–52.0)
HEMOGLOBIN: 12.5 g/dL — AB (ref 13.0–17.0)
MCH: 29.6 pg (ref 26.0–34.0)
MCHC: 31.7 g/dL (ref 30.0–36.0)
MCV: 93.4 fL (ref 78.0–100.0)
Platelets: 99 10*3/uL — ABNORMAL LOW (ref 150–400)
RBC: 4.22 MIL/uL (ref 4.22–5.81)
RDW: 15.1 % (ref 11.5–15.5)
WBC: 5.5 10*3/uL (ref 4.0–10.5)

## 2016-06-21 LAB — RENAL FUNCTION PANEL
ALBUMIN: 3 g/dL — AB (ref 3.5–5.0)
ANION GAP: 12 (ref 5–15)
BUN: 25 mg/dL — ABNORMAL HIGH (ref 6–20)
CALCIUM: 8 mg/dL — AB (ref 8.9–10.3)
CO2: 26 mmol/L (ref 22–32)
Chloride: 98 mmol/L — ABNORMAL LOW (ref 101–111)
Creatinine, Ser: 10.7 mg/dL — ABNORMAL HIGH (ref 0.61–1.24)
GFR calc non Af Amer: 5 mL/min — ABNORMAL LOW (ref >60)
GFR, EST AFRICAN AMERICAN: 6 mL/min — AB (ref >60)
Glucose, Bld: 81 mg/dL (ref 65–99)
PHOSPHORUS: 3.7 mg/dL (ref 2.5–4.6)
Potassium: 4.1 mmol/L (ref 3.5–5.1)
SODIUM: 136 mmol/L (ref 135–145)

## 2016-06-21 MED ORDER — METOPROLOL TARTRATE 50 MG PO TABS
50.0000 mg | ORAL_TABLET | Freq: Two times a day (BID) | ORAL | Status: DC
  Administered 2016-06-21 – 2016-06-22 (×2): 50 mg via ORAL
  Filled 2016-06-21 (×2): qty 1

## 2016-06-21 MED ORDER — IPRATROPIUM-ALBUTEROL 0.5-2.5 (3) MG/3ML IN SOLN
3.0000 mL | Freq: Three times a day (TID) | RESPIRATORY_TRACT | Status: DC
Start: 2016-06-21 — End: 2016-06-22
  Administered 2016-06-21 – 2016-06-22 (×4): 3 mL via RESPIRATORY_TRACT
  Filled 2016-06-21 (×2): qty 3
  Filled 2016-06-21: qty 39
  Filled 2016-06-21 (×2): qty 3

## 2016-06-21 MED ORDER — AMLODIPINE BESYLATE 10 MG PO TABS
10.0000 mg | ORAL_TABLET | Freq: Every day | ORAL | Status: DC
  Administered 2016-06-22: 10 mg via ORAL
  Filled 2016-06-21: qty 1

## 2016-06-21 NOTE — Progress Notes (Signed)
  [facility] Progress Note   Subjective: myoview showed akinetic inf wall, although the echo did not.  EF 40-45%, still having CP off and on, may need heart cath per cards note  Filed Vitals:   06/20/16 2306 06/21/16 0516 06/21/16 0725 06/21/16 0742  BP: 159/84 163/88 169/95   Pulse: 92 77 82   Temp: 98.4 F (36.9 C) 98.2 F (36.8 C) 98.3 F (36.8 C)   TempSrc: Oral Oral Oral   Resp: 15 14 14    Height:      Weight:  68.6 kg (151 lb 3.8 oz)    SpO2: 98% 96% 95% 94%    Inpatient medications: . [START ON 06/22/2016] amLODipine  10 mg Oral Daily  . aspirin EC  81 mg Oral Daily  . atorvastatin  80 mg Oral q1800  . calcitRIOL  0.5 mcg Oral Q T,Th,Sa-HD  . calcium acetate  1,334 mg Oral TID WC  . cinacalcet  60 mg Oral Daily  . heparin subcutaneous  5,000 Units Subcutaneous Q8H  . ipratropium-albuterol  3 mL Nebulization TID  . isosorbide mononitrate  15 mg Oral Daily  . lisinopril  20 mg Oral Daily  . metoprolol  50 mg Oral BID  . multivitamin  1 tablet Oral QHS  . sodium chloride flush  3 mL Intravenous Q12H     sodium chloride, sodium chloride, acetaminophen **OR** acetaminophen, alteplase, calcium acetate, heparin, heparin, hydrALAZINE, lidocaine (PF), lidocaine-prilocaine, nitroGLYCERIN, ondansetron **OR** ondansetron (ZOFRAN) IV, pentafluoroprop-tetrafluoroeth  Exam: Alert, no distress No jvd Chest clear bilat RRR soft SEM no rg Abd soft ntnd no ascites RUA AVG+bruit No LE edema   HD: to start at [facility] after dc, just moved from [place].  Dry wt per pt is around 67-68kg    Assessment: 1 ESRD TTS HD here 2 HTN cont meds 3 Anemia no need for esa 4 MBD 5 Chest pain - +inf scar on Myoview, no ischemia. Per cards 6 Vol - is at dry wt   Plan - HD TTS   Vinson Moselle MD [facility] pager [phone]    cell [phone] 06/21/2016, 10:55 AM    Recent Labs Lab 06/19/16 0506 06/20/16 1850 06/21/16 0502  NA 139 138 136  K 4.1 4.6 4.1   CL 100* 100* 98*  CO2 28 25 26   GLUCOSE 108* 95 81  BUN 24* 41* 25*  CREATININE 11.51* 15.36* 10.70*  CALCIUM 8.1* 8.0* 8.0*  PHOS  --  3.8 3.7    Recent Labs Lab 06/18/16 1844 06/19/16 0506 06/20/16 1850 06/21/16 0502  AST  --  27  --   --   ALT 30 28  --   --   ALKPHOS  --  139*  --   --   BILITOT  --  0.6  --   --   PROT  --  6.8  --   --   ALBUMIN  --  3.4* 3.4* 3.0*    Recent Labs Lab 06/18/16 1115 06/19/16 0506 06/20/16 1849 06/21/16 0502  WBC 4.0 4.6 5.0 5.5  NEUTROABS 2.4  --   --   --   HGB 11.6* 11.9* 12.7* 12.5*  HCT 34.9* 38.2* 39.2 39.4  MCV 92.6 95.7 92.2 93.4  PLT 95* 100* 98* 99*   Iron/TIBC/Ferritin/ %Sat No results found for: IRON, TIBC, FERRITIN, IRONPCTSAT

## 2016-06-21 NOTE — Progress Notes (Signed)
Admission note:  Arrival Method: Patient arrived in w/c from 3S accompanied by the staff. Mental Orientation: Alert and oriented x 4. Telemetry: 6E-03, CCMD notified, NSR. Assessment: See doc flow sheets. Skin: Warm, dry and intact, no open areas noted or any bruising noted.  Assessed by two nurses Meridee Score). IV: Left shoulder IV saline lock. Pain: Denies any pain currently. Tubes: [name] Safety Measures: Bed in low position, phone and call bell within reach.  Will continue to monitor. Fall Prevention Safety Plan: reviewed the plan, understood and acknowledged. Admission Screening: In progress. 6700 Orientation: Patient has been oriented to the unit, staff and to the room.

## 2016-06-21 NOTE — Progress Notes (Signed)
SUBJECTIVE: The patient is doing well today.  He has had some orthopnea.  Also has had recurrent chest pain during HD.   Melene Muller ON 06/22/2016] amLODipine  10 mg Oral Daily  . aspirin EC  81 mg Oral Daily  . atorvastatin  80 mg Oral q1800  . calcitRIOL  0.5 mcg Oral Q T,Th,Sa-HD  . calcium acetate  1,334 mg Oral TID WC  . cinacalcet  60 mg Oral Daily  . heparin subcutaneous  5,000 Units Subcutaneous Q8H  . ipratropium-albuterol  3 mL Nebulization TID  . isosorbide mononitrate  15 mg Oral Daily  . lisinopril  20 mg Oral Daily  . metoprolol  50 mg Oral BID  . multivitamin  1 tablet Oral QHS  . sodium chloride flush  3 mL Intravenous Q12H      OBJECTIVE: Physical Exam: Filed Vitals:   06/20/16 2306 06/21/16 0516 06/21/16 0725 06/21/16 0742  BP: 159/84 163/88 169/95   Pulse: 92 77 82   Temp: 98.4 F (36.9 C) 98.2 F (36.8 C) 98.3 F (36.8 C)   TempSrc: Oral Oral Oral   Resp: 15 14 14    Height:      Weight:  151 lb 3.8 oz (68.6 kg)    SpO2: 98% 96% 95% 94%    Intake/Output Summary (Last 24 hours) at 06/21/16 0906 Last data filed at 06/21/16 0100  Gross per 24 hour  Intake    463 ml  Output   1178 ml  Net   -715 ml    Telemetry reveals sinus rhythm  GEN- The patient is well appearing, alert and oriented x 3 today.   Head- normocephalic, atraumatic Eyes-  Sclera clear, conjunctiva pink Ears- hearing intact Oropharynx- clear Neck- supple,  Lungs- decreased BS at bases normal work of breathing Heart- Regular rate and rhythm, no murmurs, rubs or gallops, PMI not laterally displaced GI- soft, NT, ND, + BS Extremities- no clubbing, cyanosis, or edema Skin- no rash or lesion Psych- euthymic mood, full affect Neuro- strength and sensation are intact  LABS: Basic Metabolic Panel:  Recent Labs  37/62/83 1850 06/21/16 0502  NA 138 136  K 4.6 4.1  CL 100* 98*  CO2 25 26  GLUCOSE 95 81  BUN 41* 25*  CREATININE 15.36* 10.70*  CALCIUM 8.0* 8.0*  PHOS 3.8 3.7     Liver Function Tests:  Recent Labs  06/18/16 1844  06/19/16 0506 06/20/16 1850 06/21/16 0502  AST  --   --  27  --   --   ALT 30  --  28  --   --   ALKPHOS  --   --  139*  --   --   BILITOT  --   --  0.6  --   --   PROT  --   --  6.8  --   --   ALBUMIN  --   < > 3.4* 3.4* 3.0*  < > = values in this interval not displayed. No results for input(s): LIPASE, AMYLASE in the last 72 hours. CBC:  Recent Labs  06/18/16 1115  06/20/16 1849 06/21/16 0502  WBC 4.0  < > 5.0 5.5  NEUTROABS 2.4  --   --   --   HGB 11.6*  < > 12.7* 12.5*  HCT 34.9*  < > 39.2 39.4  MCV 92.6  < > 92.2 93.4  PLT 95*  < > 98* 99*  < > = values in this interval not displayed.  Cardiac Enzymes:  Recent Labs  06/19/16 0506 06/19/16 0948 06/19/16 2112  TROPONINI 0.05* 0.06* 0.05*   BNP: Invalid input(s): POCBNP D-Dimer: No results for input(s): DDIMER in the last 72 hours. Hemoglobin A1C:  Recent Labs  06/18/16 1649  HGBA1C 4.7*   Echo reviewed  ASSESSMENT AND PLAN:  Principal Problem:   ESRD (end stage renal disease) on dialysis [facility]) Active Problems:   Chest pain   Hypertension   Diabetes mellitus without complication (HCC)   Anxiety   Thrombocytopenia (HCC)   Hypertensive urgency  ASSESSMENT AND PLAN:  1. Chest Pain Both typical and atypical features myoview revealed large inferior scar but no ischemia.  Interestingly, no scar noted on echo and no q waves on ekg. Will continue medicine optimization (increase metoprolol and norvasc today).  If pain continues despite medicine optimization, may require cath.  2. SOB Multifactoral, as above Volume to be managed by nephrology  3. Hypertensive urgency Will follow closely with medicine titration as BP allows with HD  4. Renal faiulre Per nephrology  Ok to transfer to telemetry floor from my standpoint.  Hillis Range MD, [facility] 06/21/2016 9:06 AM

## 2016-06-21 NOTE — Progress Notes (Signed)
PROGRESS NOTE  Jeffery Riley  WLS:[id number] DOB: 10-20-1966 DOA: 06/18/2016 PCP: Roxanne Mins, PA-C  Brief Narrative:   Pt. with PMH of ESRD on HD, HTN; admitted on 06/18/2016, with complaint of chest pressure as well as shortness of breath.  Despite being on dialysis, the patient is very active and runs daily. He also goes to the gym on a regular basis. He has noticed that over the last 2 weeks he has had difficulty with shortness of breath even with walking. This is a dramatic change from his baseline of being able to run several miles. On the morning of admission, he developed some heaviness in of the chest and some increased shortness of breath. He is normally very compliant with his hemodialysis, however he lives in [place] and is traveling through the state and he missed some hemodialysis. He tried to routinely show up to the emergency department for hemodialysis at [facility], explaining to them that he was on vacation, however they refuse to dialyze him on his normal day because it was not emergent.  He re-presented to the hospital when his chest pressure and shortness of breath worsened.    Assessment & Plan:   Principal Problem:   ESRD (end stage renal disease) on dialysis [facility]) Active Problems:   Chest pain   Hypertension   Diabetes mellitus without complication (HCC)   Anxiety   Thrombocytopenia (HCC)   Hypertensive urgency  Chest pressure, likely due to recent MI. He continues to have some mild chest pressure but markedly improved from the date of admission. His troponins were mildly elevated but flat. His echocardiogram was concerning for a decreased ejection fraction. Nuclear medicine stress test today demonstrated no reversible ischemia, however he had a large inferior wall myocardial infarction. He had severe inferior and apical wall hypokinesis and an estimated LVEF of 43%. His stress test was deemed intermediate risk. -  Appreciate cardiology assistance  -  Continue  aspirin -  Started high dose statin  -  Continue beta blocker and ACE inhibitor  -  Beta blocker and calcium channel blocker increased by cardiology today -  If he continues to have chest pains, cardiology will consider heart catheterization tomorrow  End-stage renal disease, received hemodialysis on 7/1 -  Appreciate nephrology assistance  Essential hypertension, has been difficult to control for many years -  Continue amlodipine, lisinopril, metoprolol, doses adjusted by cardiology  Diabetes mellitus type 2, diet controlled, hemoglobin A1c 4.7   Asthma, mild dyspnea but no wheezing on exam -  Start duo nebs 3 times a day  Mild anemia of chronic kidney disease and stable mild thrombocytopenia -  Repeat CBC in AM  DVT prophylaxis:  Heparin Code Status:  Full Family Communication:  Patient alone Disposition Plan:  Nothing by mouth for possible cardiac catheterization on Monday. Okay to transfer to telemetry. Do not anticipate the need for any home health services or equipment. He has been set up for hemodialysis at [facility] on a Tuesday, Thursday, Saturday schedule when he is ready for discharge.   Consultants:  Cardiology Nephrology  Procedures: Hemodialysis Echocardiogram: Left ventricle: The cavity size was normal. Wall thickness was  increased in a pattern of mild LVH. Systolic function was mildly  reduced. The estimated ejection fraction was in the range of 45%  to 50%. Features are consistent with a pseudonormal left  ventricular filling pattern, with concomitant abnormal relaxation  and increased filling pressure (grade 2 diastolic dysfunction). - Mitral valve: There was moderate regurgitation. -  Left atrium: The atrium was mildly to moderately dilated. - Right ventricle: The cavity size was mildly dilated. Systolic  function was mildly reduced. - Right atrium: The atrium was mildly dilated. - Pericardium, extracardiac: A trivial pericardial effusion was   identified.   nuclear medicine stress test:  1. No reversible ischemia demonstrated. Large inferior wall myocardial infarction.  2. Severe inferior and apical wall hypokinesis, with moderate to severe left ventricular dilatation.  3. Left ventricular ejection fraction 43%  4. Non invasive risk stratification*: Intermediate  Subjective: Ongoing fatigue and substernal chest pressure.  Having some mild shortness of breath. States that he uses his Combivent inhaler 3 times daily normally.  iveCeasar Mons Vitals:   06/21/16 0725 06/21/16 0742 06/21/16 1137 06/21/16 1452  BP: 169/95  158/90   Pulse: 82  75   Temp: 98.3 F (36.8 C)  97.5 F (36.4 C)   TempSrc: Oral  Oral   Resp: 14  13   Height:      Weight:      SpO2: 95% 94% 96% 100%    Intake/Output Summary (Last 24 hours) at 06/21/16 1540 Last data filed at 06/21/16 0900  Gross per 24 hour  Intake    283 ml  Output   1178 ml  Net   -895 ml   Filed Weights   06/20/16 1750 06/20/16 2100 06/21/16 0516  Weight: 67 kg (147 lb 11.3 oz) 66.7 kg (147 lb 0.8 oz) 68.6 kg (151 lb 3.8 oz)    Examination:  General exam:  Adult Male.  No acute distress.  HEENT:  NCAT, MMM Respiratory system: Clear to auscultation bilaterally Cardiovascular system: Regular rate and rhythm, normal S1/S2. No murmurs, rubs, gallops or clicks.  Warm extremities Gastrointestinal system: Normal active bowel sounds, soft, nondistended, nontender. MSK:  Normal tone and bulk, no lower extremity edema, right arm fistula with good bruit and thrill  Neuro:  Grossly intact    Data Reviewed: I have personally reviewed following labs and imaging studies  CBC:  Recent Labs Lab 06/18/16 1115 06/19/16 0506 06/20/16 1849 06/21/16 0502  WBC 4.0 4.6 5.0 5.5  NEUTROABS 2.4  --   --   --   HGB 11.6* 11.9* 12.7* 12.5*  HCT 34.9* 38.2* 39.2 39.4  MCV 92.6 95.7 92.2 93.4  PLT 95* 100* 98* 99*   Basic Metabolic Panel:  Recent Labs Lab 06/18/16 1115  06/19/16 0506 06/20/16 1850 06/21/16 0502  NA 139 139 138 136  K 3.8 4.1 4.6 4.1  CL 97* 100* 100* 98*  CO2 24 28 25 26   GLUCOSE 109* 108* 95 81  BUN 60* 24* 41* 25*  CREATININE 21.06* 11.51* 15.36* 10.70*  CALCIUM 8.0* 8.1* 8.0* 8.0*  PHOS  --   --  3.8 3.7   GFR: Estimated Creatinine Clearance: 7.6 mL/min (by C-G formula based on Cr of 10.7). Liver Function Tests:  Recent Labs Lab 06/18/16 1844 06/19/16 0506 06/20/16 1850 06/21/16 0502  AST  --  27  --   --   ALT 30 28  --   --   ALKPHOS  --  139*  --   --   BILITOT  --  0.6  --   --   PROT  --  6.8  --   --   ALBUMIN  --  3.4* 3.4* 3.0*   No results for input(s): LIPASE, AMYLASE in the last 168 hours. No results for input(s): AMMONIA in the last 168 hours. Coagulation Profile: No results  for input(s): INR, PROTIME in the last 168 hours. Cardiac Enzymes:  Recent Labs Lab 06/18/16 1649 06/19/16 0005 06/19/16 0506 06/19/16 0948 06/19/16 2112  TROPONINI 0.06* 0.06* 0.05* 0.06* 0.05*   BNP (last 3 results) No results for input(s): PROBNP in the last 8760 hours. HbA1C:  Recent Labs  06/18/16 1649  HGBA1C 4.7*   CBG:  Recent Labs Lab 06/20/16 0835 06/20/16 1258 06/20/16 1703 06/20/16 2225 06/21/16 0756  GLUCAP 92 129* 110* 95 78   Lipid Profile: No results for input(s): CHOL, HDL, LDLCALC, TRIG, CHOLHDL, LDLDIRECT in the last 72 hours. Thyroid Function Tests: No results for input(s): TSH, T4TOTAL, FREET4, T3FREE, THYROIDAB in the last 72 hours. Anemia Panel: No results for input(s): VITAMINB12, FOLATE, FERRITIN, TIBC, IRON, RETICCTPCT in the last 72 hours. Urine analysis: No results found for: COLORURINE, APPEARANCEUR, LABSPEC, PHURINE, GLUCOSEU, HGBUR, BILIRUBINUR, KETONESUR, PROTEINUR, UROBILINOGEN, NITRITE, LEUKOCYTESUR Sepsis Labs: @LABRCNTIP (procalcitonin:4,lacticidven:4)  ) Recent Results (from the past 240 hour(s))  MRSA PCR Screening     Status: None   Collection Time: 06/18/16  4:01  PM  Result Value Ref Range Status   MRSA by PCR NEGATIVE NEGATIVE Final    Comment:        The GeneXpert MRSA Assay (FDA approved for NASAL specimens only), is one component of a comprehensive MRSA colonization surveillance program. It is not intended to diagnose MRSA infection nor to guide or monitor treatment for MRSA infections.       Radiology Studies: Nm Myocar Multi W/spect W/wall Motion / Ef  06/20/2016  CLINICAL DATA:  Chest pain.  Diabetes and hypertension. EXAM: MYOCARDIAL IMAGING WITH SPECT (REST AND PHARMACOLOGIC-STRESS) GATED LEFT VENTRICULAR WALL MOTION STUDY LEFT VENTRICULAR EJECTION FRACTION TECHNIQUE: Standard myocardial SPECT imaging was performed after resting intravenous injection of 10 mCi Tc-60m tetrofosmin. Subsequently, intravenous infusion of Lexiscan was performed under the supervision of the Cardiology staff. At peak effect of the drug, 30 mCi Tc-22m tetrofosmin was injected intravenously and standard myocardial SPECT imaging was performed. Quantitative gated imaging was also performed to evaluate left ventricular wall motion, and estimate left ventricular ejection fraction. COMPARISON:  None. FINDINGS: Perfusion: Large myocardial perfusion defect throughout the inferior wall of the left ventricle on stress imaging, which shows no evidence of reversibility on resting images. This is consistent with myocardial infarction. No reversible myocardial perfusion defects are seen to suggest reversible ischemia. Wall Motion: Severe inferior and apical wall hypokinesis. Moderate to severe left ventricular dilatation. Left Ventricular Ejection Fraction: 43 % End diastolic volume 237 ml End systolic volume 134 ml IMPRESSION: 1. No reversible ischemia demonstrated. Large inferior wall myocardial infarction. 2. Severe inferior and apical wall hypokinesis, with moderate to severe left ventricular dilatation. 3. Left ventricular ejection fraction 43% 4. Non invasive risk stratification*:  Intermediate *2012 Appropriate Use Criteria for Coronary Revascularization Focused Update: J Am Coll Cardiol. 2012;59(9):857-881. http://[email or web address].[email or web address] Electronically Signed   By: Myles Rosenthal M.D.   On: 06/20/2016 13:57     Scheduled Meds: . [START ON 06/22/2016] amLODipine  10 mg Oral Daily  . aspirin EC  81 mg Oral Daily  . atorvastatin  80 mg Oral q1800  . calcitRIOL  0.5 mcg Oral Q T,Th,Sa-HD  . calcium acetate  1,334 mg Oral TID WC  . cinacalcet  60 mg Oral Daily  . heparin subcutaneous  5,000 Units Subcutaneous Q8H  . ipratropium-albuterol  3 mL Nebulization TID  . isosorbide mononitrate  15 mg Oral Daily  . lisinopril  20 mg Oral Daily  . metoprolol  50 mg Oral BID  . multivitamin  1 tablet Oral QHS  . sodium chloride flush  3 mL Intravenous Q12H   Continuous Infusions:    LOS: 3 days    Time spent: 30 min    Renae Fickle, MD [facility] Pager [phone]  If 7PM-7AM, please contact night-coverage [email or web address] Password [email or web address] 06/21/2016, 3:40 PM

## 2016-06-22 DIAGNOSIS — I160 Hypertensive urgency: Secondary | ICD-10-CM

## 2016-06-22 DIAGNOSIS — I219 Acute myocardial infarction, unspecified: Secondary | ICD-10-CM

## 2016-06-22 DIAGNOSIS — R072 Precordial pain: Secondary | ICD-10-CM

## 2016-06-22 DIAGNOSIS — I2510 Atherosclerotic heart disease of native coronary artery without angina pectoris: Secondary | ICD-10-CM

## 2016-06-22 DIAGNOSIS — I5021 Acute systolic (congestive) heart failure: Secondary | ICD-10-CM

## 2016-06-22 DIAGNOSIS — I25119 Atherosclerotic heart disease of native coronary artery with unspecified angina pectoris: Secondary | ICD-10-CM

## 2016-06-22 DIAGNOSIS — R9439 Abnormal result of other cardiovascular function study: Secondary | ICD-10-CM

## 2016-06-22 LAB — CBC
HCT: 37.2 % — ABNORMAL LOW (ref 39.0–52.0)
HEMOGLOBIN: 11.8 g/dL — AB (ref 13.0–17.0)
MCH: 29.6 pg (ref 26.0–34.0)
MCHC: 31.7 g/dL (ref 30.0–36.0)
MCV: 93.2 fL (ref 78.0–100.0)
PLATELETS: 107 10*3/uL — AB (ref 150–400)
RBC: 3.99 MIL/uL — AB (ref 4.22–5.81)
RDW: 14.9 % (ref 11.5–15.5)
WBC: 5.4 10*3/uL (ref 4.0–10.5)

## 2016-06-22 LAB — RENAL FUNCTION PANEL
Albumin: 3.4 g/dL — ABNORMAL LOW (ref 3.5–5.0)
Anion gap: 22 — ABNORMAL HIGH (ref 5–15)
BUN: 38 mg/dL — AB (ref 6–20)
CHLORIDE: 93 mmol/L — AB (ref 101–111)
CO2: 25 mmol/L (ref 22–32)
Calcium: 8.8 mg/dL — ABNORMAL LOW (ref 8.9–10.3)
Creatinine, Ser: 13.6 mg/dL — ABNORMAL HIGH (ref 0.61–1.24)
GFR, EST AFRICAN AMERICAN: 4 mL/min — AB (ref >60)
GFR, EST NON AFRICAN AMERICAN: 4 mL/min — AB (ref >60)
Glucose, Bld: 97 mg/dL (ref 65–99)
PHOSPHORUS: 4 mg/dL (ref 2.5–4.6)
POTASSIUM: 4.5 mmol/L (ref 3.5–5.1)
SODIUM: 140 mmol/L (ref 135–145)

## 2016-06-22 LAB — GLUCOSE, CAPILLARY
GLUCOSE-CAPILLARY: 87 mg/dL (ref 65–99)
GLUCOSE-CAPILLARY: 109 mg/dL — AB (ref 65–99)
Glucose-Capillary: 95 mg/dL (ref 65–99)
Glucose-Capillary: 114 mg/dL — ABNORMAL HIGH (ref 65–99)

## 2016-06-22 MED ORDER — AMLODIPINE BESYLATE 10 MG PO TABS: 10.0000 mg | ORAL_TABLET | Freq: Every day | ORAL | Status: AC

## 2016-06-22 MED ORDER — METOPROLOL TARTRATE 100 MG PO TABS: 100.0000 mg | ORAL_TABLET | Freq: Two times a day (BID) | ORAL | Status: DC

## 2016-06-22 MED ORDER — METOPROLOL TARTRATE 100 MG PO TABS: 100.0000 mg | ORAL_TABLET | Freq: Two times a day (BID) | ORAL | Status: AC

## 2016-06-22 MED ORDER — NITROGLYCERIN 0.4 MG SL SUBL: 0.4000 mg | SUBLINGUAL_TABLET | SUBLINGUAL | Status: AC | PRN

## 2016-06-22 MED ORDER — IPRATROPIUM-ALBUTEROL 20-100 MCG/ACT IN AERS: 1.0000 | INHALATION_SPRAY | Freq: Three times a day (TID) | RESPIRATORY_TRACT | Status: AC

## 2016-06-22 MED ORDER — ATORVASTATIN CALCIUM 80 MG PO TABS: 80.0000 mg | ORAL_TABLET | Freq: Every day | ORAL | Status: AC

## 2016-06-22 NOTE — Discharge Summary (Addendum)
Physician Discharge Summary  Jeffery Riley UEA:[id number] DOB: [birth year] DOA: 06/18/2016  PCP: Roxanne Mins, PA-C  Admit date: 06/18/2016 Discharge date: 06/22/2016  Admitted From: home  Disposition:  home  Recommendations for Outpatient Follow-up:  1. Follow up with cardiology for left heart catheterization on Friday, July 7 2. Hemodialysis at [facility] dialysis center on Tuesday, Thursday, Saturday second shift  Home Health:  none  Equipment/Devices:  None  Discharge Condition:  Stable, improved CODE STATUS:  full  Diet recommendation:  Renal dialysis   Brief/Interim Summary:  Pt. with PMH of ESRD on HD, HTN; admitted on 06/18/2016, with complaint of chest pressure as well as shortness of breath. He was previously able to run many miles on a daily basis, but about two weeks prior to admission, he developed severe fatigue and dyspnea with exertion.  He felt that something was wrong.  On the date of admission, he experience chest heaviness.  ECHO demonstrated a mild reduction in EF and NM stress test was concerning for an area of infarction.  There were no areas of reversible ischemia, however, he continued to have some mild chest heaviness.  He has been stable for more than 48 hours and adjustments were made to optimize his blood pressure.  He will follow up with cardiology on Friday for left heart catheterization.  He has been scheduled for [facility] center, 2nd shift TTS schedule.    Discharge Diagnoses:  Principal Problem:   Acute myocardial infarction [facility]) Active Problems:   Chest pain   Hypertension   Diabetes mellitus without complication (HCC)   Anxiety   ESRD (end stage renal disease) on dialysis (HCC)   Thrombocytopenia (HCC)   Hypertensive urgency   Pain in the chest   CAD (coronary artery disease), native coronary artery   Acute systolic heart failure (HCC)    CAD with recent MI.  His troponins were mildly elevated but flat. His echocardiogram was concerning  for a decreased ejection fraction of 45-50 percent. There were no obvious regional wall motion abnormalities on echocardiogram. Cardiology recommended a follow-up stress test.  Nuclear medicine stress test demonstrated no reversible ischemia, however he had a large inferior wall myocardial infarction.  He had severe inferior and apical wall hypokinesis and an estimated LVEF of 43%. His stress test was deemed intermediate risk.   - Appreciate cardiology assistance  - Continue aspirin - Started high dose statin.  Lipid panel ordered and pending - Continue beta blocker and ACE inhibitor  - Beta blocker and calcium channel blocker were gradually increased -  He was started on imdur - He will follow up with cardiology on Friday for left heart catheterization  Acute systolic heart failure secondary to ischemic cardiomyopathy, recent MI -  Volume management per HD -  Continue beta blocker, ACEI  End-stage renal disease, seen by Nephrology who assisted with setting of outpatient HD.  He received hemodialysis on 7/1 and is due to return to HD on 7/4  Essential hypertension, has been difficult to control for many years.  Continued amlodipine, lisinopril, metoprolol, doses adjusted slightly during hospitalization.    Diabetes mellitus type 2, diet controlled, hemoglobin A1c 4.7   Asthma, mild dyspnea but no wheezing.  He was given duonebs which helped and will continue combivent.    Mild anemia of chronic kidney disease and stable mild thrombocytopenia.  Did not require transfusions.  Management per nephrology and primary care.    Discharge Instructions      Discharge Instructions    (  HEART FAILURE PATIENTS) Call MD:  Anytime you have any of the following symptoms: 1) 3 pound weight gain in 24 hours or 5 pounds in 1 week 2) shortness of breath, with or without a dry hacking cough 3) swelling in the hands, feet or stomach 4) if you have to sleep on extra pillows at night in order to breathe.     Complete by:  As directed      Call MD for:  difficulty breathing, headache or visual disturbances    Complete by:  As directed      Call MD for:  extreme fatigue    Complete by:  As directed      Call MD for:  hives    Complete by:  As directed      Call MD for:  persistant dizziness or light-headedness    Complete by:  As directed      Call MD for:  persistant nausea and vomiting    Complete by:  As directed      Call MD for:  severe uncontrolled pain    Complete by:  As directed      Call MD for:  temperature >100.4    Complete by:  As directed      Diet - low sodium heart healthy    Complete by:  As directed      Increase activity slowly    Complete by:  As directed             Medication List    TAKE these medications        amLODipine 10 MG tablet  Commonly known as:  NORVASC  Take 1 tablet (10 mg total) by mouth daily.     aspirin EC 81 MG tablet  Take 81 mg by mouth daily.     atorvastatin 80 MG tablet  Commonly known as:  LIPITOR  Take 1 tablet (80 mg total) by mouth daily at 6 PM.     calcitRIOL 0.5 MCG capsule  Commonly known as:  ROCALTROL  Take 1 capsule (0.5 mcg total) by mouth Every Tuesday,Thursday,and Saturday with dialysis.     calcium acetate 667 MG capsule  Commonly known as:  PHOSLO  Take 667-1,334 mg by mouth See admin instructions. Take 2 capsules (1334 mg) by mouth 3 times daily with meals and 1 capsule (667 mg) with snacks     cinacalcet 60 MG tablet  Commonly known as:  SENSIPAR  Take 60 mg by mouth daily.     folic acid-vitamin b complex-vitamin c-selenium-zinc 3 MG Tabs tablet  Take 1 tablet by mouth daily.     Ipratropium-Albuterol 20-100 MCG/ACT Aers respimat  Commonly known as:  COMBIVENT RESPIMAT  Inhale 1 puff into the lungs 3 (three) times daily.     isosorbide mononitrate 30 MG 24 hr tablet  Commonly known as:  IMDUR  Take 0.5 tablets (15 mg total) by mouth daily.     lisinopril 20 MG tablet  Commonly known as:   PRINIVIL,ZESTRIL  Take 20 mg by mouth daily.     metoprolol 100 MG tablet  Commonly known as:  LOPRESSOR  Take 1 tablet (100 mg total) by mouth 2 (two) times daily.     multivitamin Tabs tablet  Take 1 tablet by mouth at bedtime.     nitroGLYCERIN 0.4 MG SL tablet  Commonly known as:  NITROSTAT  Place 1 tablet (0.4 mg total) under the tongue every 5 (five) minutes as needed for chest  pain.       Follow-up Information    Follow up with Roxanne Mins, PA-C.   Specialty:  Cardiology   Contact information:   [facility]  [address] [name] [address] [phone]       Follow up with [facility]. Go on 06/26/2016.   Why:  Go to admitting in the [facility], arrive at 7 am. Nothing to eat or drink after midnight.    Contact information:   [address] [place] [address] [phone]     Allergies  Allergen Reactions  . Compazine [Prochlorperazine] Other (See Comments)    hallucinations  . Penicillins Shortness Of Breath    Has patient had a PCN reaction causing immediate rash, facial/tongue/throat swelling, SOB or lightheadedness with hypotension: Yes Has patient had a PCN reaction causing severe rash involving mucus membranes or skin necrosis: No Has patient had a PCN reaction that required hospitalization Yes Has patient had a PCN reaction occurring within the last 10 years: Yes If all of the above answers are "NO", then may proceed with Cephalosporin use.  . Clonidine Derivatives Other (See Comments)    Muscle spasms  . Levaquin [Levofloxacin In D5w] Diarrhea    Consultations: Cardiology, [facility]   Procedures/Studies: Dg Chest 2 View  06/18/2016  CLINICAL DATA:  Shortness of breath, chest pain for 3 days EXAM: CHEST  2 VIEW COMPARISON:  6/27/ 17 FINDINGS: Cardiomegaly again noted. No infiltrate or pleural effusion. No pulmonary edema. Mild degenerative changes thoracic spine again noted.  IMPRESSION: No active cardiopulmonary disease.  Cardiomegaly again noted. Electronically Signed   By: Natasha Mead M.D.   On: 06/18/2016 11:26   Nm Myocar Multi W/spect W/wall Motion / Ef  06/20/2016  CLINICAL DATA:  Chest pain.  Diabetes and hypertension. EXAM: MYOCARDIAL IMAGING WITH SPECT (REST AND PHARMACOLOGIC-STRESS) GATED LEFT VENTRICULAR WALL MOTION STUDY LEFT VENTRICULAR EJECTION FRACTION TECHNIQUE: Standard myocardial SPECT imaging was performed after resting intravenous injection of 10 mCi Tc-74m tetrofosmin. Subsequently, intravenous infusion of Lexiscan was performed under the supervision of the Cardiology staff. At peak effect of the drug, 30 mCi Tc-54m tetrofosmin was injected intravenously and standard myocardial SPECT imaging was performed. Quantitative gated imaging was also performed to evaluate left ventricular wall motion, and estimate left ventricular ejection fraction. COMPARISON:  None. FINDINGS: Perfusion: Large myocardial perfusion defect throughout the inferior wall of the left ventricle on stress imaging, which shows no evidence of reversibility on resting images. This is consistent with myocardial infarction. No reversible myocardial perfusion defects are seen to suggest reversible ischemia. Wall Motion: Severe inferior and apical wall hypokinesis. Moderate to severe left ventricular dilatation. Left Ventricular Ejection Fraction: 43 % End diastolic volume 237 ml End systolic volume 134 ml IMPRESSION: 1. No reversible ischemia demonstrated. Large inferior wall myocardial infarction. 2. Severe inferior and apical wall hypokinesis, with moderate to severe left ventricular dilatation. 3. Left ventricular ejection fraction 43% 4. Non invasive risk stratification*: Intermediate *2012 Appropriate Use Criteria for Coronary Revascularization Focused Update: J Am Coll Cardiol. 2012;59(9):857-881. http://content.dementiazones.com.aspx?articleid=[email or web address] Electronically Signed   By: Myles Rosenthal  M.D.   On: 06/20/2016 13:57   Echocardiogram: Left ventricle: The cavity size was normal. Wall thickness was  increased in a pattern of mild LVH. Systolic function was mildly  reduced. The estimated ejection fraction was in the range of 45%  to 50%. Features are consistent with a pseudonormal left  ventricular filling pattern, with concomitant abnormal relaxation  and  increased filling pressure (grade 2 diastolic dysfunction). - Mitral valve: There was moderate regurgitation. - Left atrium: The atrium was mildly to moderately dilated. - Right ventricle: The cavity size was mildly dilated. Systolic  function was mildly reduced. - Right atrium: The atrium was mildly dilated. - Pericardium, extracardiac: A trivial pericardial effusion was  identified.  Subjective:  Feeling well.  Able to ambulate halls and denies worsening chest pressure with ambulation.  Not back to his former baseline, but better than admission.  SOB improved with duonebs.    Discharge Exam: Filed Vitals:   06/22/16 0427 06/22/16 0828  BP: 164/85 150/74  Pulse: 81 94  Temp: 98 F (36.7 C) 98.4 F (36.9 C)  Resp: 18 17   Filed Vitals:   06/22/16 0049 06/22/16 0427 06/22/16 0828 06/22/16 0900  BP: 164/89 164/85 150/74   Pulse:  81 94   Temp:  98 F (36.7 C) 98.4 F (36.9 C)   TempSrc:  Oral Oral   Resp:  18 17   Height:      Weight:      SpO2:  94% 94% 98%    General exam: Adult Male. No acute distress.  HEENT: NCAT, MMM Respiratory system: Clear to auscultation bilaterally Cardiovascular system: Regular rate and rhythm, normal S1/S2. No murmurs, rubs, gallops or clicks. Warm extremities Gastrointestinal system: Normal active bowel sounds, soft, nondistended, nontender. MSK: Normal tone and bulk, no lower extremity edema, right arm fistula with good bruit and thrill  Neuro: Grossly intact    The results of significant diagnostics from this hospitalization (including imaging,  microbiology, ancillary and laboratory) are listed below for reference.     Microbiology: Recent Results (from the past 240 hour(s))  MRSA PCR Screening     Status: None   Collection Time: 06/18/16  4:01 PM  Result Value Ref Range Status   MRSA by PCR NEGATIVE NEGATIVE Final    Comment:        The GeneXpert MRSA Assay (FDA approved for NASAL specimens only), is one component of a comprehensive MRSA colonization surveillance program. It is not intended to diagnose MRSA infection nor to guide or monitor treatment for MRSA infections.      Labs: BNP (last 3 results) No results for input(s): BNP in the last 8760 hours. Basic Metabolic Panel:  Recent Labs Lab 06/18/16 1115 06/19/16 0506 06/20/16 1850 06/21/16 0502 06/22/16 0253  NA 139 139 138 136 140  K 3.8 4.1 4.6 4.1 4.5  CL 97* 100* 100* 98* 93*  CO2 24 28 25 26 25   GLUCOSE 109* 108* 95 81 97  BUN 60* 24* 41* 25* 38*  CREATININE 21.06* 11.51* 15.36* 10.70* 13.60*  CALCIUM 8.0* 8.1* 8.0* 8.0* 8.8*  PHOS  --   --  3.8 3.7 4.0   Liver Function Tests:  Recent Labs Lab 06/18/16 1844 06/19/16 0506 06/20/16 1850 06/21/16 0502 06/22/16 0253  AST  --  27  --   --   --   ALT 30 28  --   --   --   ALKPHOS  --  139*  --   --   --   BILITOT  --  0.6  --   --   --   PROT  --  6.8  --   --   --   ALBUMIN  --  3.4* 3.4* 3.0* 3.4*   No results for input(s): LIPASE, AMYLASE in the last 168 hours. No results for input(s): AMMONIA in the last  168 hours. CBC:  Recent Labs Lab 06/18/16 1115 06/19/16 0506 06/20/16 1849 06/21/16 0502 06/22/16 0253  WBC 4.0 4.6 5.0 5.5 5.4  NEUTROABS 2.4  --   --   --   --   HGB 11.6* 11.9* 12.7* 12.5* 11.8*  HCT 34.9* 38.2* 39.2 39.4 37.2*  MCV 92.6 95.7 92.2 93.4 93.2  PLT 95* 100* 98* 99* 107*   Cardiac Enzymes:  Recent Labs Lab 06/18/16 1649 06/19/16 0005 06/19/16 0506 06/19/16 0948 06/19/16 2112  TROPONINI 0.06* 0.06* 0.05* 0.06* 0.05*   BNP: Invalid input(s):  POCBNP CBG:  Recent Labs Lab 06/20/16 1703 06/20/16 2225 06/21/16 0756 06/21/16 2046 06/22/16 0728  GLUCAP 110* 95 78 102* 87   D-Dimer No results for input(s): DDIMER in the last 72 hours. Hgb A1c No results for input(s): HGBA1C in the last 72 hours. Lipid Profile No results for input(s): CHOL, HDL, LDLCALC, TRIG, CHOLHDL, LDLDIRECT in the last 72 hours. Thyroid function studies No results for input(s): TSH, T4TOTAL, T3FREE, THYROIDAB in the last 72 hours.  Invalid input(s): FREET3 Anemia work up No results for input(s): VITAMINB12, FOLATE, FERRITIN, TIBC, IRON, RETICCTPCT in the last 72 hours. Urinalysis No results found for: COLORURINE, APPEARANCEUR, LABSPEC, PHURINE, GLUCOSEU, HGBUR, BILIRUBINUR, KETONESUR, PROTEINUR, UROBILINOGEN, NITRITE, LEUKOCYTESUR Sepsis Labs Invalid input(s): PROCALCITONIN,  WBC,  LACTICIDVEN Microbiology Recent Results (from the past 240 hour(s))  MRSA PCR Screening     Status: None   Collection Time: 06/18/16  4:01 PM  Result Value Ref Range Status   MRSA by PCR NEGATIVE NEGATIVE Final    Comment:        The GeneXpert MRSA Assay (FDA approved for NASAL specimens only), is one component of a comprehensive MRSA colonization surveillance program. It is not intended to diagnose MRSA infection nor to guide or monitor treatment for MRSA infections.      Time coordinating discharge: Over 30 minutes  SIGNED:   Renae Fickle, MD  [facility] 06/22/2016, 11:30 AM Pager   If 7PM-7AM, please contact night-coverage [email or web address] Password [email or web address]

## 2016-06-22 NOTE — Progress Notes (Signed)
Subjective:  Denies chest pain or sob this am   Objective Vital signs in last 24 hours: Filed Vitals:   06/21/16 2047 06/21/16 2048 06/22/16 0049 06/22/16 0427  BP: 188/91  164/89 164/85  Pulse: 89   81  Temp: 98.1 F (36.7 C)   98 F (36.7 C)  TempSrc: Oral   Oral  Resp: 19   18  Height:      Weight:  69.7 kg (153 lb 10.6 oz)    SpO2: 98%   94%   Weight change: 1.5 kg (3 lb 4.9 oz)  Physical Exam: General: Alert AAM NAD Pleasant , OX3 Heart: RRR, no Mur, fur, gallop Lungs: CTA  Abdomen: Soft , NT, ND   Extremities: No pedal edema Dialysis Access: Pos bruit RUA AVGG   [id number] Orders: Center: last in [facility] [name] [facility] on TTS . EDW 68kg per pt /67 records HD Bath 2k/ 2.25ca Time 3hr Heparin 2000. Access RUA AVG  Po calcitriol 0.84mcg /HD Mircera q2 weeks last given 05/26/16 Venofer 100 mg load through 06/20/16  Other op labs from [facility] =(hgb 10.4 (05/26/16) tfs 21% Corec Ca 8.9 phos 4.1 pth 1,072 =all on 05/26/16)    Problem/Plan: 1.  Voume overload - Resolved from admit (had missed HD X2 sec to travel from Out of town) Admit Pedal edema resolved with HD / his admit cxr showed no pul edema. edw 67kg (cramped last HD at 66 KG) 2. ESRD -HD TTS ( NEW PATIENT  To [facility]  This admit and [facility] when dc ) He tells me this am wanting to stay with [facility] / Will be Living  in [facility]  3. Chest Pain- Card wu /  With ""severe inferior and apical wall hypokinesis and an estimated LVEF of 43%. His stress test was deemed intermediate risk."" on stress test /  4. Anemia - hgb  11.9 no esa  5. Secondary hyperparathyroidism - phos 4.0  corec ca 9.2  On phos lo ,sensipar / po Vit d  On hd (last OP pth 1,000 from his records,"insur . Did not pay Sensipar"") 6. HTN- Amlodipine 10 mg q day (change to HS help with bp control on HD days),lisinopriol 20 mg qday /Metoprolo 50 mg bid  7. Diabetes mellitus Type 2 - per admit / diet controlled, hemoglobin A1c 4.7 / He tells me  Never took meds for   Jeffery Pastel, PA-C [facility] Beeper [phone] 06/22/2016,7:54 AM  I have seen and examined this patient and agree with plan per Jeffery Riley.  He feels well and denies any CP.  Unclear what cardiology plans are.  He has outpt spot 2nd shift TTS at [facility]. I would increase metoprolol due to ? Cardiac ds and HTN.  Let us know if plan is to DC today. Masey Scheiber T,MD 06/22/2016 10:58 AM Labs: Basic Metabolic Panel:  Recent Labs Lab 06/20/16 1850 06/21/16 0502 06/22/16 0253  NA 138 136 140  K 4.6 4.1 4.5  CL 100* 98* 93*  CO2 GLUCOSE 95 81 97  BUN 41* 25* 38*  CREATININE 15.36* 10.70* 13.60*  CALCIUM 8.0* 8.0* 8.8*  PHOS 3.8 3.7 4.0   Liver Function Tests:  Recent Labs Lab 06/18/16 1844  06/19/16 0506 06/20/16 1850 06/21/16 0502 06/22/16 0253  AST  --   --  27  --   --   --   ALT 30  --  28  --   --   --  ALKPHOS  --   --  139*  --   --   --   BILITOT  --   --  0.6  --   --   --   PROT  --   --  6.8  --   --   --   ALBUMIN  --   < > 3.4* 3.4* 3.0* 3.4*  < > = values in this interval not displayed. No results for input(s): LIPASE, AMYLASE in the last 168 hours. No results for input(s): AMMONIA in the last 168 hours. CBC:  Recent Labs Lab 06/18/16 1115 06/19/16 0506 06/20/16 1849 06/21/16 0502 06/22/16 0253  WBC 4.0 4.6 5.0 5.5 5.4  NEUTROABS 2.4  --   --   --   --   HGB 11.6* 11.9* 12.7* 12.5* 11.8*  HCT 34.9* 38.2* 39.2 39.4 37.2*  MCV 92.6 95.7 92.2 93.4 93.2  PLT 95* 100* 98* 99* 107*   Cardiac Enzymes:  Recent Labs Lab 06/18/16 1649 06/19/16 0005 06/19/16 0506 06/19/16 0948 06/19/16 2112  TROPONINI 0.06* 0.06* 0.05* 0.06* 0.05*   CBG:  Recent Labs Lab 06/20/16 1258 06/20/16 1703 06/20/16 2225 06/21/16 0756 06/21/16 2046  GLUCAP 129* 110* 95 78 102*    Medications:   . amLODipine  10 mg Oral Daily  . aspirin EC  81 mg Oral Daily  . atorvastatin  80 mg Oral q1800  .  calcitRIOL  0.5 mcg Oral Q T,Th,Sa-HD  . calcium acetate  1,334 mg Oral TID WC  . cinacalcet  60 mg Oral Daily  . heparin subcutaneous  5,000 Units Subcutaneous Q8H  . ipratropium-albuterol  3 mL Nebulization TID  . isosorbide mononitrate  15 mg Oral Daily  . lisinopril  20 mg Oral Daily  . metoprolol  50 mg Oral BID  . multivitamin  1 tablet Oral QHS  . sodium chloride flush  3 mL Intravenous Q12H

## 2016-06-22 NOTE — Progress Notes (Signed)
Patient Name: Jeffery Riley Date of Encounter: 06/22/2016  Principal Problem:   ESRD (end stage renal disease) on dialysis [facility]) Active Problems:   Chest pain   Hypertension   Diabetes mellitus without complication (HCC)   Anxiety   Thrombocytopenia (HCC)   Hypertensive urgency   Pain in the chest   Primary Cardiologist: New- Dr. 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 Presented with chest pain after HD treatment, Echo showed EF of 45-50%, Lexiscan Myoview was intermediate risk.   SUBJECTIVE: Feels ok but with less energy today. Denies chest pain overnight.   OBJECTIVE Filed Vitals:   06/22/16 0049 06/22/16 0427 06/22/16 0828 06/22/16 0900  BP: 164/89 164/85 150/74   Pulse:  81 94   Temp:  98 F (36.7 C) 98.4 F (36.9 C)   TempSrc:  Oral Oral   Resp:  18 17   Height:      Weight:      SpO2:  94% 94% 98%    Intake/Output Summary (Last 24 hours) at 06/22/16 0947 Last data filed at 06/22/16 4098  Gross per 24 hour  Intake    160 ml  Output      0 ml  Net    160 ml   Filed Weights   06/21/16 0516 06/21/16 1738 06/21/16 2048  Weight: 151 lb 3.8 oz (68.6 kg) 151 lb 0.2 oz (68.5 kg) 153 lb 10.6 oz (69.7 kg)    PHYSICAL EXAM General: Well developed, well nourished, male in no acute distress. Head: Normocephalic, atraumatic.  Neck: Supple without bruits, no JVD. Lungs:  Resp regular and unlabored, CTA. Heart: RRR, S1, S2, no S3, S4, or murmur; no rub. Abdomen: Soft, non-tender, non-distended, BS + x 4.  Extremities: No clubbing, cyanosis, no edema.  Neuro: Alert and oriented X 3. Moves all extremities spontaneously. Psych: Normal affect.  LABS: CBC: Recent Labs  06/21/16 0502 06/22/16 0253  WBC 5.5 5.4  HGB 12.5* 11.8*  HCT 39.4 37.2*  MCV 93.4 93.2  PLT 99* 107*   Basic Metabolic Panel: Recent Labs  06/21/16 0502 06/22/16 0253  NA 136 140  K 4.1 4.5    CL 98* 93*  CO2 26 25  GLUCOSE 81 97  BUN 25* 38*  CREATININE 10.70* 13.60*  CALCIUM 8.0* 8.8*  PHOS 3.7 4.0   Liver Function Tests: Recent Labs  06/21/16 0502 06/22/16 0253  ALBUMIN 3.0* 3.4*   Cardiac Enzymes: Recent Labs  06/19/16 0948 06/19/16 2112  TROPONINI 0.06* 0.05*     Current facility-administered medications:  .  0.9 %  sodium chloride infusion, 100 mL, Intravenous, PRN, Delano Metz, MD .  0.9 %  sodium chloride infusion, 100 mL, Intravenous, PRN, Delano Metz, MD .  acetaminophen (TYLENOL) tablet 650 mg, 650 mg, Oral, Q6H PRN **OR** acetaminophen (TYLENOL) suppository 650 mg, 650 mg, Rectal, Q6H PRN, Lesle Chris Black, NP .  alteplase (CATHFLO ACTIVASE) injection 2 mg, 2 mg, Intracatheter, Once PRN, Delano Metz, MD .  amLODipine (NORVASC) tablet 10 mg, 10 mg, Oral, Daily, Hillis Range, MD .  aspirin EC tablet 81 mg, 81 mg, Oral, Daily, Rolly Salter, MD, 81 mg at 06/21/16 0856 .  atorvastatin (LIPITOR) tablet 80 mg, 80 mg, Oral, q1800, Renae Fickle, MD, 80 mg at 06/21/16 1812 .  calcitRIOL (ROCALTROL) capsule 0.5 mcg, 0.5 mcg, Oral, Q T,Th,Sa-HD, Lenny Pastel, PA-C, 0.5 mcg  at 06/20/16 1320 .  calcium acetate (PHOSLO) capsule 1,334 mg, 1,334 mg, Oral, TID WC, Rolly Salter, MD, 1,334 mg at 06/22/16 1610 .  calcium acetate (PHOSLO) capsule 667 mg, 667 mg, Oral, PRN, Rolly Salter, MD .  cinacalcet [facility]) tablet 60 mg, 60 mg, Oral, Daily, Lesle Chris Black, NP, 60 mg at 06/22/16 0854 .  heparin injection 1,000 Units, 1,000 Units, Dialysis, PRN, Delano Metz, MD .  heparin injection 3,500 Units, 3,500 Units, Dialysis, PRN, Delano Metz, MD .  heparin injection 5,000 Units, 5,000 Units, Subcutaneous, Q8H, Renae Fickle, MD, 5,000 Units at 06/21/16 2058 .  hydrALAZINE (APRESOLINE) injection 5 mg, 5 mg, Intravenous, Q4H PRN, Gwenyth Bender, NP, 5 mg at 06/20/16 0045 .  ipratropium-albuterol (DUONEB) 0.5-2.5 (3) MG/3ML nebulizer solution 3 mL, 3 mL,  Nebulization, TID, Renae Fickle, MD, 3 mL at 06/22/16 0900 .  isosorbide mononitrate (IMDUR) 24 hr tablet 15 mg, 15 mg, Oral, Daily, Rolly Salter, MD, 15 mg at 06/21/16 0856 .  lidocaine (PF) (XYLOCAINE) 1 % injection 5 mL, 5 mL, Intradermal, PRN, Delano Metz, MD .  lidocaine-prilocaine (EMLA) cream 1 application, 1 application, Topical, PRN, Delano Metz, MD .  lisinopril (PRINIVIL,ZESTRIL) tablet 20 mg, 20 mg, Oral, Daily, Rolly Salter, MD, 20 mg at 06/21/16 0856 .  metoprolol (LOPRESSOR) tablet 50 mg, 50 mg, Oral, BID, Hillis Range, MD, 50 mg at 06/21/16 2101 .  multivitamin (RENA-VIT) tablet 1 tablet, 1 tablet, Oral, QHS, Lenny Pastel, PA-C, 1 tablet at 06/21/16 2101 .  nitroGLYCERIN (NITROSTAT) SL tablet 0.4 mg, 0.4 mg, Sublingual, Q5 min PRN, Rolly Salter, MD, 0.4 mg at 06/19/16 1729 .  ondansetron (ZOFRAN) tablet 4 mg, 4 mg, Oral, Q6H PRN **OR** ondansetron (ZOFRAN) injection 4 mg, 4 mg, Intravenous, Q6H PRN, Lesle Chris Black, NP .  pentafluoroprop-tetrafluoroeth (GEBAUERS) aerosol 1 application, 1 application, Topical, PRN, Delano Metz, MD .  sodium chloride flush (NS) 0.9 % injection 3 mL, 3 mL, Intravenous, Q12H, Lesle Chris Black, NP, 3 mL at 06/21/16 2104      TELE:   NSR     ECG: NSR, prolonged QTC.   Radiology/Studies: Nm Myocar Multi W/spect W/wall Motion / Ef  06/20/2016  CLINICAL DATA:  Chest pain.  Diabetes and hypertension. EXAM: MYOCARDIAL IMAGING WITH SPECT (REST AND PHARMACOLOGIC-STRESS) GATED LEFT VENTRICULAR WALL MOTION STUDY LEFT VENTRICULAR EJECTION FRACTION TECHNIQUE: Standard myocardial SPECT imaging was performed after resting intravenous injection of 10 mCi Tc-13m tetrofosmin. Subsequently, intravenous infusion of Lexiscan was performed under the supervision of the Cardiology staff. At peak effect of the drug, 30 mCi Tc-14m tetrofosmin was injected intravenously and standard myocardial SPECT imaging was performed. Quantitative gated imaging was also  performed to evaluate left ventricular wall motion, and estimate left ventricular ejection fraction. COMPARISON:  None. FINDINGS: Perfusion: Large myocardial perfusion defect throughout the inferior wall of the left ventricle on stress imaging, which shows no evidence of reversibility on resting images. This is consistent with myocardial infarction. No reversible myocardial perfusion defects are seen to suggest reversible ischemia. Wall Motion: Severe inferior and apical wall hypokinesis. Moderate to severe left ventricular dilatation. Left Ventricular Ejection Fraction: 43 % End diastolic volume 237 ml End systolic volume 134 ml IMPRESSION: 1. No reversible ischemia demonstrated. Large inferior wall myocardial infarction. 2. Severe inferior and apical wall hypokinesis, with moderate to severe left ventricular dilatation. 3. Left ventricular ejection fraction 43% 4. Non invasive risk stratification*: Intermediate *2012 Appropriate Use Criteria for Coronary Revascularization Focused Update: J  Am Coll Cardiol. 2012;59(9):857-881. http://content.dementiazones.com.aspx?[email or web address] Electronically Signed   By: Myles Rosenthal M.D.   On: 06/20/2016 13:57     Current Medications:  . amLODipine  10 mg Oral Daily  . aspirin EC  81 mg Oral Daily  . atorvastatin  80 mg Oral q1800  . calcitRIOL  0.5 mcg Oral Q T,Th,Sa-HD  . calcium acetate  1,334 mg Oral TID WC  . cinacalcet  60 mg Oral Daily  . heparin subcutaneous  5,000 Units Subcutaneous Q8H  . ipratropium-albuterol  3 mL Nebulization TID  . isosorbide mononitrate  15 mg Oral Daily  . lisinopril  20 mg Oral Daily  . metoprolol  50 mg Oral BID  . multivitamin  1 tablet Oral QHS  . sodium chloride flush  3 mL Intravenous Q12H      ASSESSMENT AND PLAN: Principal Problem:   ESRD (end stage renal disease) on dialysis [facility]) Active Problems:   Chest pain   Hypertension   Diabetes mellitus without complication (HCC)   Anxiety   Thrombocytopenia  (HCC)   Hypertensive urgency   Pain in the chest  1. Atypical angina: Myoview shows large myocardial perfusion defect throughout the inferior wall of the left ventricle on stress imaging, which shows no evidence of reversibility on resting images. This is consistent with myocardial infarction. No reversible myocardial perfusion defects are seen to suggest reversible ischemia.  Wall Motion: Severe inferior and apical wall hypokinesis. Moderate to severe left ventricular dilatation.  No Q waves on EKG. MD to advise cath as he has risk factors for CAD and ongoing chest pain while in HD. He is a long distance runner and reports that one year ago he was in the middle of a run and developed acute onset left sided chest pain with SOB and fell to the ground. He had chest pain after HD on Saturday, that he says he thought was more due to the amount of volume taken off, but describes the pain as the same left sided, sharp chest pain. No radiation, no associated SOB or diaphoresis. His mother has a history of CAD and has stents, as well as his maternal grandmother has a history of MI. He is not a smoker. Last A1C was 4.7.   Continue current medical therapy with ASA, amlodipine, ACE and beta blocker.   2. Hypertension: Remains hypertensive, could be contributory to his chest pain. Can increase metoprolol to 100mg  BID.   3. ESRD on HD: HD on T/Th/Sat. Per Renal.   4. HLD: On high intensity statin, no lipid panel for review. Will order for am.   Signed, Little Ishikawa , NP 9:47 AM 06/22/2016 Pager [phone]  I have personally seen and examined this patient with Suzzette Righter, NP. I agree with the assessment and plan as outlined above. He has atypical chest pain but his nuclear stress test is abnormal. There is no clear ischemia. The inferior wall moves well on the echo. His overall LV function is preserved. He is very active and actually ran a 21 mile race within the last month with no chest pain. No  recurrence of chest pain over last 48 hours. The pain was sharp during dialysis. Given the abnormal stress test, I think we should exclude CAD with a cath but due to the cath schedule today, this could not be done today. Tomorrow is a holiday so no caths will be done. Will be ok to d/c him home today and we will arrange outpatient cardiac cath on Friday  July 7th at 9:00. He should arrive at 7am into procedural admitting area.    Verne Carrow 06/22/2016 10:44 AM

## 2016-06-22 NOTE — Progress Notes (Signed)
Paged Dr. Allena Katz to clarify where pt needs to go for outpatient dialysis. Per Dr. Allena Katz pt is to go to [facility] at 0900 for paperwork and the center will receive orders for treatment. Instructed pt to do so; pt agreeable and verbalized understanding.

## 2016-06-22 NOTE — Care Management Note (Signed)
Case Management Note  Patient Details  Name: Jeffery Riley MRN: [id number] Date of Birth: 1966-07-23  Subjective/Objective:         CM following for progression and d/c planning.           Action/Plan: Noted plan to d/c this pt to home and plan to return for cardiac cath on Friday, July 7th for a cardiac cath. No HH or DME needs at this time. Pt has a HD center and a seat time.   Expected Discharge Date:     06/22/2016             Expected Discharge Plan:  Home/Self Care  In-House Referral:  NA  Discharge planning Services  NA  Post Acute Care Choice:  NA Choice offered to:  NA  DME Arranged:  [name] DME Agency:  NA  HH Arranged:  NA HH Agency:  NA  Status of Service:  Completed, signed off  If discussed at Long Length of Stay Meetings, dates discussed:    Additional Comments:  Starlyn Skeans, RN 06/22/2016, 2:46 PM

## 2016-06-22 NOTE — Progress Notes (Signed)
Nurse Reviewed D/C paperwork with patient. Pt verbalized understanding.

## 2016-06-26 ENCOUNTER — Encounter (HOSPITAL_COMMUNITY)
Admission: RE | Disposition: A | Discharge status: Home / Self Care | Payer: Self-pay | Source: Ambulatory Visit | Attending: Interventional Cardiology

## 2016-06-26 ENCOUNTER — Ambulatory Visit (HOSPITAL_COMMUNITY)
Admission: RE | Admit: 2016-06-26 | Discharge: 2016-06-26 | Disposition: A | Discharge status: Home / Self Care | Payer: Medicaid Other | Source: Ambulatory Visit | Attending: Interventional Cardiology | Admitting: Interventional Cardiology

## 2016-06-26 DIAGNOSIS — F419 Anxiety disorder, unspecified: Secondary | ICD-10-CM | POA: Insufficient documentation

## 2016-06-26 DIAGNOSIS — R079 Chest pain, unspecified: Secondary | ICD-10-CM | POA: Diagnosis present

## 2016-06-26 DIAGNOSIS — I5042 Chronic combined systolic (congestive) and diastolic (congestive) heart failure: Secondary | ICD-10-CM | POA: Diagnosis not present

## 2016-06-26 DIAGNOSIS — E1122 Type 2 diabetes mellitus with diabetic chronic kidney disease: Secondary | ICD-10-CM | POA: Diagnosis not present

## 2016-06-26 DIAGNOSIS — N186 End stage renal disease: Secondary | ICD-10-CM | POA: Insufficient documentation

## 2016-06-26 DIAGNOSIS — Z992 Dependence on renal dialysis: Secondary | ICD-10-CM | POA: Diagnosis not present

## 2016-06-26 DIAGNOSIS — I132 Hypertensive heart and chronic kidney disease with heart failure and with stage 5 chronic kidney disease, or end stage renal disease: Secondary | ICD-10-CM | POA: Diagnosis not present

## 2016-06-26 DIAGNOSIS — E785 Hyperlipidemia, unspecified: Secondary | ICD-10-CM | POA: Insufficient documentation

## 2016-06-26 DIAGNOSIS — Z8249 Family history of ischemic heart disease and other diseases of the circulatory system: Secondary | ICD-10-CM | POA: Diagnosis not present

## 2016-06-26 DIAGNOSIS — R9439 Abnormal result of other cardiovascular function study: Secondary | ICD-10-CM | POA: Diagnosis present

## 2016-06-26 DIAGNOSIS — E119 Type 2 diabetes mellitus without complications: Secondary | ICD-10-CM

## 2016-06-26 DIAGNOSIS — R0789 Other chest pain: Secondary | ICD-10-CM | POA: Diagnosis not present

## 2016-06-26 DIAGNOSIS — Z7982 Long term (current) use of aspirin: Secondary | ICD-10-CM | POA: Diagnosis not present

## 2016-06-26 DIAGNOSIS — I2510 Atherosclerotic heart disease of native coronary artery without angina pectoris: Secondary | ICD-10-CM | POA: Insufficient documentation

## 2016-06-26 DIAGNOSIS — D696 Thrombocytopenia, unspecified: Secondary | ICD-10-CM | POA: Diagnosis not present

## 2016-06-26 DIAGNOSIS — I119 Hypertensive heart disease without heart failure: Secondary | ICD-10-CM | POA: Diagnosis present

## 2016-06-26 HISTORY — PX: CARDIAC CATHETERIZATION: SHX172

## 2016-06-26 LAB — PROTIME-INR
INR: 1.07 (ref 0.00–1.49)
PROTHROMBIN TIME: 14.1 s (ref 11.6–15.2)

## 2016-06-26 LAB — GLUCOSE, CAPILLARY: Glucose-Capillary: 88 mg/dL (ref 65–99)

## 2016-06-26 SURGERY — LEFT HEART CATH AND CORONARY ANGIOGRAPHY
Anesthesia: LOCAL

## 2016-06-26 MED ORDER — NITROGLYCERIN 0.4 MG SL SUBL
0.4000 mg | SUBLINGUAL_TABLET | SUBLINGUAL | Status: DC | PRN
  Administered 2016-06-26: 0.4 mg via SUBLINGUAL

## 2016-06-26 MED ORDER — ACETAMINOPHEN 325 MG PO TABS: 650.0000 mg | ORAL_TABLET | ORAL | Status: DC | PRN

## 2016-06-26 MED ORDER — SODIUM CHLORIDE 0.9% FLUSH: 3.0000 mL | INTRAVENOUS | Status: DC | PRN

## 2016-06-26 MED ORDER — ASPIRIN 81 MG PO CHEW
81.0000 mg | CHEWABLE_TABLET | ORAL | Status: AC
  Administered 2016-06-26: 81 mg via ORAL

## 2016-06-26 MED ORDER — LIDOCAINE HCL (PF) 1 % IJ SOLN
INTRAMUSCULAR | Status: AC
  Filled 2016-06-26: qty 30

## 2016-06-26 MED ORDER — SODIUM CHLORIDE 0.9 % WEIGHT BASED INFUSION: 1.0000 mL/kg/h | INTRAVENOUS | Status: DC

## 2016-06-26 MED ORDER — IOPAMIDOL (ISOVUE-370) INJECTION 76%
INTRAVENOUS | Status: DC | PRN
  Administered 2016-06-26: 75 mL via INTRA_ARTERIAL

## 2016-06-26 MED ORDER — FENTANYL CITRATE (PF) 100 MCG/2ML IJ SOLN
INTRAMUSCULAR | Status: AC
  Filled 2016-06-26: qty 2

## 2016-06-26 MED ORDER — MIDAZOLAM HCL 2 MG/2ML IJ SOLN
INTRAMUSCULAR | Status: AC
  Filled 2016-06-26: qty 2

## 2016-06-26 MED ORDER — HEPARIN (PORCINE) IN NACL 2-0.9 UNIT/ML-% IJ SOLN
INTRAMUSCULAR | Status: DC | PRN
  Administered 2016-06-26: 1000 mL

## 2016-06-26 MED ORDER — ONDANSETRON HCL 4 MG/2ML IJ SOLN: 4.0000 mg | Freq: Four times a day (QID) | INTRAMUSCULAR | Status: DC | PRN

## 2016-06-26 MED ORDER — SODIUM CHLORIDE 0.9% FLUSH: 3.0000 mL | Freq: Two times a day (BID) | INTRAVENOUS | Status: DC

## 2016-06-26 MED ORDER — ASPIRIN 81 MG PO CHEW
CHEWABLE_TABLET | ORAL | Status: AC
  Filled 2016-06-26: qty 1

## 2016-06-26 MED ORDER — SODIUM CHLORIDE 0.9 % IV SOLN: 250.0000 mL | INTRAVENOUS | Status: DC | PRN

## 2016-06-26 MED ORDER — FENTANYL CITRATE (PF) 100 MCG/2ML IJ SOLN
INTRAMUSCULAR | Status: DC | PRN
  Administered 2016-06-26 (×2): 50 ug via INTRAVENOUS

## 2016-06-26 MED ORDER — LIDOCAINE HCL (PF) 1 % IJ SOLN
INTRAMUSCULAR | Status: DC | PRN
  Administered 2016-06-26: 8 mL via SUBCUTANEOUS

## 2016-06-26 MED ORDER — NITROGLYCERIN 0.4 MG SL SUBL
SUBLINGUAL_TABLET | SUBLINGUAL | Status: AC
  Filled 2016-06-26: qty 1

## 2016-06-26 MED ORDER — SODIUM CHLORIDE 0.9 % WEIGHT BASED INFUSION: 3.0000 mL/kg/h | INTRAVENOUS | Status: AC

## 2016-06-26 MED ORDER — OXYCODONE-ACETAMINOPHEN 5-325 MG PO TABS: 1.0000 | ORAL_TABLET | ORAL | Status: DC | PRN

## 2016-06-26 MED ORDER — IOPAMIDOL (ISOVUE-370) INJECTION 76%
INTRAVENOUS | Status: AC
  Filled 2016-06-26: qty 100

## 2016-06-26 MED ORDER — MIDAZOLAM HCL 2 MG/2ML IJ SOLN
INTRAMUSCULAR | Status: DC | PRN
  Administered 2016-06-26 (×2): 1 mg via INTRAVENOUS

## 2016-06-26 MED ORDER — HEPARIN (PORCINE) IN NACL 2-0.9 UNIT/ML-% IJ SOLN
INTRAMUSCULAR | Status: AC
  Filled 2016-06-26: qty 1000

## 2016-06-26 SURGICAL SUPPLY — 8 items
CATH INFINITI JR4 5F (CATHETERS) ×2 IMPLANT
CATH SITESEER 5F MULTI A 2 (CATHETERS) ×2 IMPLANT
KIT HEART LEFT (KITS) ×2 IMPLANT
PACK CARDIAC CATHETERIZATION (CUSTOM PROCEDURE TRAY) ×2 IMPLANT
SHEATH PINNACLE 5F 10CM (SHEATH) ×2 IMPLANT
TRANSDUCER W/STOPCOCK (MISCELLANEOUS) ×2 IMPLANT
TUBING CIL FLEX 10 FLL-RA (TUBING) IMPLANT
WIRE EMERALD 3MM-J .035X150CM (WIRE) ×2 IMPLANT

## 2016-06-26 NOTE — Interval H&P Note (Signed)
Cath Lab Visit (complete for each Cath Lab visit)  Clinical Evaluation Leading to the Procedure:   ACS: No.  Non-ACS:    Anginal Classification: CCS III  Anti-ischemic medical therapy: Maximal Therapy (2 or more classes of medications)  Non-Invasive Test Results: No non-invasive testing performed  Prior CABG: No previous CABG      History and Physical Interval Note:  06/26/2016 10:13 AM  Jeffery Riley  has presented today for surgery, with the diagnosis of chest pain  The various methods of treatment have been discussed with the patient and family. After consideration of risks, benefits and other options for treatment, the patient has consented to  Procedure(s): Left Heart Cath and Coronary Angiography ([name]) as a surgical intervention .  The patient's history has been reviewed, patient examined, no change in status, stable for surgery.  I have reviewed the patient's chart and labs.  Questions were answered to the patient's satisfaction.     Lyn Records III

## 2016-06-26 NOTE — Discharge Instructions (Signed)
Angiogram, Care After °Refer to this sheet in the next few weeks. These instructions provide you with information about caring for yourself after your procedure. Your health care provider may also give you more specific instructions. Your treatment has been planned according to current medical practices, but problems sometimes occur. Call your health care provider if you have any problems or questions after your procedure. °WHAT TO EXPECT AFTER THE PROCEDURE °After your procedure, it is typical to have the following: °· Bruising at the catheter insertion site that usually fades within 1-2 weeks. °· Blood collecting in the tissue (hematoma) that may be painful to the touch. It should usually decrease in size and tenderness within 1-2 weeks. °HOME CARE INSTRUCTIONS °· Take medicines only as directed by your health care provider. °· You may shower 24-48 hours after the procedure or as directed by your health care provider. Remove the bandage (dressing) and gently wash the site with plain soap and water. Pat the area dry with a clean towel. Do not rub the site, because this may cause bleeding. °· Do not take baths, swim, or use a hot tub until your health care provider approves. °· Check your insertion site every day for redness, swelling, or drainage. °· Do not apply powder or lotion to the site. °· Do not lift over 10 lb (4.5 kg) for 5 days after your procedure or as directed by your health care provider. °· Ask your health care provider when it is okay to: °¨ Return to work or school. °¨ Resume usual physical activities or sports. °¨ Resume sexual activity. °· Do not drive home if you are discharged the same day as the procedure. Have someone else drive you. °· You may drive 24 hours after the procedure unless otherwise instructed by your health care provider. °· Do not operate machinery or power tools for 24 hours after the procedure or as directed by your health care provider. °· If your procedure was done as an  outpatient procedure, which means that you went home the same day as your procedure, a responsible adult should be with you for the first 24 hours after you arrive home. °· Keep all follow-up visits as directed by your health care provider. This is important. °SEEK MEDICAL CARE IF: °· You have a fever. °· You have chills. °· You have increased bleeding from the catheter insertion site. Hold pressure on the site. °SEEK IMMEDIATE MEDICAL CARE IF: °· You have unusual pain at the catheter insertion site. °· You have redness, warmth, or swelling at the catheter insertion site. °· You have drainage (other than a small amount of blood on the dressing) from the catheter insertion site. °· The catheter insertion site is bleeding, and the bleeding does not stop after 30 minutes of holding steady pressure on the site. °· The area near or just beyond the catheter insertion site becomes pale, cool, tingly, or numb. °  °This information is not intended to replace advice given to you by your health care provider. Make sure you discuss any questions you have with your health care provider. °  °Document Released: 06/25/2005 Document Revised: 12/28/2014 Document Reviewed: 05/10/2013 °[employer]vier Interactive Patient Education ©2016 [employer] ° °

## 2016-06-26 NOTE — H&P (View-Only) (Signed)
   Patient Name: Jeffery Riley Date of Encounter: 06/22/2016  Principal Problem:   ESRD (end stage renal disease) on dialysis (HCC) Active Problems:   Chest pain   Hypertension   Diabetes mellitus without complication (HCC)   Anxiety   Thrombocytopenia (HCC)   Hypertensive urgency   Pain in the chest   Primary Cardiologist: New- Dr. 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 Presented with chest pain after HD treatment, Echo showed EF of 45-50%, Lexiscan Myoview was intermediate risk.   SUBJECTIVE: Feels ok but with less energy today. Denies chest pain overnight.   OBJECTIVE Filed Vitals:   06/22/16 0049 06/22/16 0427 06/22/16 0828 06/22/16 0900  BP: 164/89 164/85 150/74   Pulse:  81 94   Temp:  98 F (36.7 C) 98.4 F (36.9 C)   TempSrc:  Oral Oral   Resp:  18 17   Height:      Weight:      SpO2:  94% 94% 98%    Intake/Output Summary (Last 24 hours) at 06/22/16 0947 Last data filed at 06/22/16 0828  Gross per 24 hour  Intake    160 ml  Output      0 ml  Net    160 ml   Filed Weights   06/21/16 0516 06/21/16 1738 06/21/16 2048  Weight: 151 lb 3.8 oz (68.6 kg) 151 lb 0.2 oz (68.5 kg) 153 lb 10.6 oz (69.7 kg)    PHYSICAL EXAM General: Well developed, well nourished, male in no acute distress. Head: Normocephalic, atraumatic.  Neck: Supple without bruits, no JVD. Lungs:  Resp regular and unlabored, CTA. Heart: RRR, S1, S2, no S3, S4, or murmur; no rub. Abdomen: Soft, non-tender, non-distended, BS + x 4.  Extremities: No clubbing, cyanosis, no edema.  Neuro: Alert and oriented X 3. Moves all extremities spontaneously. Psych: Normal affect.  LABS: CBC: Recent Labs  06/21/16 0502 06/22/16 0253  WBC 5.5 5.4  HGB 12.5* 11.8*  HCT 39.4 37.2*  MCV 93.4 93.2  PLT 99* 107*   Basic Metabolic Panel: Recent Labs  06/21/16 0502 06/22/16 0253  NA 136 140  K 4.1 4.5    CL 98* 93*  CO2 26 25  GLUCOSE 81 97  BUN 25* 38*  CREATININE 10.70* 13.60*  CALCIUM 8.0* 8.8*  PHOS 3.7 4.0   Liver Function Tests: Recent Labs  06/21/16 0502 06/22/16 0253  ALBUMIN 3.0* 3.4*   Cardiac Enzymes: Recent Labs  06/19/16 0948 06/19/16 2112  TROPONINI 0.06* 0.05*     Current facility-administered medications:  .  0.9 %  sodium chloride infusion, 100 mL, Intravenous, PRN, Robert Schertz, MD .  0.9 %  sodium chloride infusion, 100 mL, Intravenous, PRN, Robert Schertz, MD .  acetaminophen (TYLENOL) tablet 650 mg, 650 mg, Oral, Q6H PRN **OR** acetaminophen (TYLENOL) suppository 650 mg, 650 mg, Rectal, Q6H PRN, Karen M Black, NP .  alteplase (CATHFLO ACTIVASE) injection 2 mg, 2 mg, Intracatheter, Once PRN, Robert Schertz, MD .  amLODipine (NORVASC) tablet 10 mg, 10 mg, Oral, Daily, James Allred, MD .  aspirin EC tablet 81 mg, 81 mg, Oral, Daily, Pranav M Patel, MD, 81 mg at 06/21/16 0856 .  atorvastatin (LIPITOR) tablet 80 mg, 80 mg, Oral, q1800, Mackenzie Short, MD, 80 mg at 06/21/16 1812 .  calcitRIOL (ROCALTROL) capsule 0.5 mcg, 0.5 mcg, Oral, Q T,Th,Sa-HD, David Zeyfang, PA-C, 0.5 mcg   at 06/20/16 1320 .  calcium acetate (PHOSLO) capsule 1,334 mg, 1,334 mg, Oral, TID WC, Pranav M Patel, MD, 1,334 mg at 06/22/16 0622 .  calcium acetate (PHOSLO) capsule 667 mg, 667 mg, Oral, PRN, Pranav M Patel, MD .  cinacalcet (SENSIPAR) tablet 60 mg, 60 mg, Oral, Daily, Karen M Black, NP, 60 mg at 06/22/16 0854 .  heparin injection 1,000 Units, 1,000 Units, Dialysis, PRN, Robert Schertz, MD .  heparin injection 3,500 Units, 3,500 Units, Dialysis, PRN, Robert Schertz, MD .  heparin injection 5,000 Units, 5,000 Units, Subcutaneous, Q8H, Mackenzie Short, MD, 5,000 Units at 06/21/16 2058 .  hydrALAZINE (APRESOLINE) injection 5 mg, 5 mg, Intravenous, Q4H PRN, Karen M Black, NP, 5 mg at 06/20/16 0045 .  ipratropium-albuterol (DUONEB) 0.5-2.5 (3) MG/3ML nebulizer solution 3 mL, 3 mL,  Nebulization, TID, Mackenzie Short, MD, 3 mL at 06/22/16 0900 .  isosorbide mononitrate (IMDUR) 24 hr tablet 15 mg, 15 mg, Oral, Daily, Pranav M Patel, MD, 15 mg at 06/21/16 0856 .  lidocaine (PF) (XYLOCAINE) 1 % injection 5 mL, 5 mL, Intradermal, PRN, Robert Schertz, MD .  lidocaine-prilocaine (EMLA) cream 1 application, 1 application, Topical, PRN, Robert Schertz, MD .  lisinopril (PRINIVIL,ZESTRIL) tablet 20 mg, 20 mg, Oral, Daily, Pranav M Patel, MD, 20 mg at 06/21/16 0856 .  metoprolol (LOPRESSOR) tablet 50 mg, 50 mg, Oral, BID, James Allred, MD, 50 mg at 06/21/16 2101 .  multivitamin (RENA-VIT) tablet 1 tablet, 1 tablet, Oral, QHS, David Zeyfang, PA-C, 1 tablet at 06/21/16 2101 .  nitroGLYCERIN (NITROSTAT) SL tablet 0.4 mg, 0.4 mg, Sublingual, Q5 min PRN, Pranav M Patel, MD, 0.4 mg at 06/19/16 1729 .  ondansetron (ZOFRAN) tablet 4 mg, 4 mg, Oral, Q6H PRN **OR** ondansetron (ZOFRAN) injection 4 mg, 4 mg, Intravenous, Q6H PRN, Karen M Black, NP .  pentafluoroprop-tetrafluoroeth (GEBAUERS) aerosol 1 application, 1 application, Topical, PRN, Robert Schertz, MD .  sodium chloride flush (NS) 0.9 % injection 3 mL, 3 mL, Intravenous, Q12H, Karen M Black, NP, 3 mL at 06/21/16 2104      TELE:   NSR     ECG: NSR, prolonged QTC.   Radiology/Studies: Nm Myocar Multi W/spect W/wall Motion / Ef  06/20/2016  CLINICAL DATA:  Chest pain.  Diabetes and hypertension. EXAM: MYOCARDIAL IMAGING WITH SPECT (REST AND PHARMACOLOGIC-STRESS) GATED LEFT VENTRICULAR WALL MOTION STUDY LEFT VENTRICULAR EJECTION FRACTION TECHNIQUE: Standard myocardial SPECT imaging was performed after resting intravenous injection of 10 mCi Tc-99m tetrofosmin. Subsequently, intravenous infusion of Lexiscan was performed under the supervision of the Cardiology staff. At peak effect of the drug, 30 mCi Tc-99m tetrofosmin was injected intravenously and standard myocardial SPECT imaging was performed. Quantitative gated imaging was also  performed to evaluate left ventricular wall motion, and estimate left ventricular ejection fraction. COMPARISON:  None. FINDINGS: Perfusion: Large myocardial perfusion defect throughout the inferior wall of the left ventricle on stress imaging, which shows no evidence of reversibility on resting images. This is consistent with myocardial infarction. No reversible myocardial perfusion defects are seen to suggest reversible ischemia. Wall Motion: Severe inferior and apical wall hypokinesis. Moderate to severe left ventricular dilatation. Left Ventricular Ejection Fraction: 43 % End diastolic volume 237 ml End systolic volume 134 ml IMPRESSION: 1. No reversible ischemia demonstrated. Large inferior wall myocardial infarction. 2. Severe inferior and apical wall hypokinesis, with moderate to severe left ventricular dilatation. 3. Left ventricular ejection fraction 43% 4. Non invasive risk stratification*: Intermediate *2012 Appropriate Use Criteria for Coronary Revascularization Focused Update: J   Am Coll Cardiol. 2012;59(9):857-881. http://content.onlinejacc.org/article.aspx?articleid=[email or web address] Electronically Signed   By: John  Stahl M.D.   On: 06/20/2016 13:57     Current Medications:  . amLODipine  10 mg Oral Daily  . aspirin EC  81 mg Oral Daily  . atorvastatin  80 mg Oral q1800  . calcitRIOL  0.5 mcg Oral Q T,Th,Sa-HD  . calcium acetate  1,334 mg Oral TID WC  . cinacalcet  60 mg Oral Daily  . heparin subcutaneous  5,000 Units Subcutaneous Q8H  . ipratropium-albuterol  3 mL Nebulization TID  . isosorbide mononitrate  15 mg Oral Daily  . lisinopril  20 mg Oral Daily  . metoprolol  50 mg Oral BID  . multivitamin  1 tablet Oral QHS  . sodium chloride flush  3 mL Intravenous Q12H      ASSESSMENT AND PLAN: Principal Problem:   ESRD (end stage renal disease) on dialysis (HCC) Active Problems:   Chest pain   Hypertension   Diabetes mellitus without complication (HCC)   Anxiety   Thrombocytopenia  (HCC)   Hypertensive urgency   Pain in the chest  1. Atypical angina: Myoview shows large myocardial perfusion defect throughout the inferior wall of the left ventricle on stress imaging, which shows no evidence of reversibility on resting images. This is consistent with myocardial infarction. No reversible myocardial perfusion defects are seen to suggest reversible ischemia.  Wall Motion: Severe inferior and apical wall hypokinesis. Moderate to severe left ventricular dilatation.  No Q waves on EKG. MD to advise cath as he has risk factors for CAD and ongoing chest pain while in HD. He is a long distance runner and reports that one year ago he was in the middle of a run and developed acute onset left sided chest pain with SOB and fell to the ground. He had chest pain after HD on Saturday, that he says he thought was more due to the amount of volume taken off, but describes the pain as the same left sided, sharp chest pain. No radiation, no associated SOB or diaphoresis. His mother has a history of CAD and has stents, as well as his maternal grandmother has a history of MI. He is not a smoker. Last A1C was 4.7.   Continue current medical therapy with ASA, amlodipine, ACE and beta blocker.   2. Hypertension: Remains hypertensive, could be contributory to his chest pain. Can increase metoprolol to 100mg BID.   3. ESRD on HD: HD on T/Th/Sat. Per Renal.   4. HLD: On high intensity statin, no lipid panel for review. Will order for am.   Signed, Erin E Smith , NP 9:47 AM 06/22/2016 Pager [phone]  I have personally seen and examined this patient with Erin Smith, NP. I agree with the assessment and plan as outlined above. He has atypical chest pain but his nuclear stress test is abnormal. There is no clear ischemia. The inferior wall moves well on the echo. His overall LV function is preserved. He is very active and actually ran a 21 mile race within the last month with no chest pain. No  recurrence of chest pain over last 48 hours. The pain was sharp during dialysis. Given the abnormal stress test, I think we should exclude CAD with a cath but due to the cath schedule today, this could not be done today. Tomorrow is a holiday so no caths will be done. Will be ok to d/c him home today and we will arrange outpatient cardiac cath on Friday   July 7th at 9:00. He should arrive at 7am into procedural admitting area.    Eric Morganti 06/22/2016 10:44 AM   

## 2016-06-26 NOTE — Research (Signed)
[facility] Informed Consent   Subject Name: Jeffery Riley  Subject met inclusion and exclusion criteria.  The informed consent form, study requirements and expectations were reviewed with the subject and questions and concerns were addressed prior to the signing of the consent form.  The subject verbalized understanding of the trail requirements.  The subject agreed to participate in the [facility] trial and signed the informed consent.  The informed consent was obtained prior to performance of any protocol-specific procedures for the subject.  A copy of the signed informed consent was given to the subject and a copy was placed in the subject's medical record.  Sandie Ano 06/26/2016, 7:40

## 2016-06-26 NOTE — Op Note (Signed)
Widely patent coronary arteries. 50% LAD. EF 50% with elevated EDP at 19 mmHg.

## 2016-06-26 NOTE — Progress Notes (Signed)
49F sheath pulled from R femoral artery by Adalberto Ill. 20 minutes manual pressure applied. Small amount of local edema noted around the site, soft with no discoloration. Site dressed with tegaderm and 4x4. Instructions given to pt. Bedrest begins at 1215. DP +2.

## 2016-06-29 ENCOUNTER — Encounter (HOSPITAL_COMMUNITY): Payer: Self-pay | Admitting: Interventional Cardiology

## 2016-09-01 ENCOUNTER — Other Ambulatory Visit: Payer: Self-pay

## 2016-09-01 DIAGNOSIS — T82510A Breakdown (mechanical) of surgically created arteriovenous fistula, initial encounter: Secondary | ICD-10-CM

## 2016-09-03 ENCOUNTER — Encounter: Payer: Self-pay | Admitting: Vascular Surgery

## 2016-09-07 ENCOUNTER — Ambulatory Visit (HOSPITAL_COMMUNITY): Payer: Medicaid Other

## 2016-09-09 ENCOUNTER — Encounter: Payer: Self-pay | Admitting: Vascular Surgery

## 2016-09-09 ENCOUNTER — Ambulatory Visit (INDEPENDENT_AMBULATORY_CARE_PROVIDER_SITE_OTHER): Payer: Medicaid Other | Admitting: Vascular Surgery

## 2016-09-09 VITALS — BP 169/100 | HR 68 | Ht 67.0 in | Wt 148.8 lb

## 2016-09-09 DIAGNOSIS — N184 Chronic kidney disease, stage 4 (severe): Secondary | ICD-10-CM
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
## 2016-09-15 ENCOUNTER — Encounter: Payer: Self-pay | Admitting: Nephrology

## 2016-11-17 ENCOUNTER — Emergency Department (HOSPITAL_BASED_OUTPATIENT_CLINIC_OR_DEPARTMENT_OTHER): Payer: Medicaid Other

## 2016-11-17 ENCOUNTER — Encounter (HOSPITAL_BASED_OUTPATIENT_CLINIC_OR_DEPARTMENT_OTHER): Payer: Self-pay

## 2016-11-17 ENCOUNTER — Emergency Department (HOSPITAL_BASED_OUTPATIENT_CLINIC_OR_DEPARTMENT_OTHER)
Admission: EM | Admit: 2016-11-17 | Discharge: 2016-11-18 | Disposition: A | Discharge status: Home / Self Care | Payer: Medicaid Other | Attending: Emergency Medicine | Admitting: Emergency Medicine

## 2016-11-17 DIAGNOSIS — I2510 Atherosclerotic heart disease of native coronary artery without angina pectoris: Secondary | ICD-10-CM | POA: Diagnosis not present

## 2016-11-17 DIAGNOSIS — E1122 Type 2 diabetes mellitus with diabetic chronic kidney disease: Secondary | ICD-10-CM | POA: Insufficient documentation

## 2016-11-17 DIAGNOSIS — Z7982 Long term (current) use of aspirin: Secondary | ICD-10-CM | POA: Insufficient documentation

## 2016-11-17 DIAGNOSIS — R0602 Shortness of breath: Secondary | ICD-10-CM

## 2016-11-17 DIAGNOSIS — J40 Bronchitis, not specified as acute or chronic: Secondary | ICD-10-CM | POA: Insufficient documentation

## 2016-11-17 DIAGNOSIS — N186 End stage renal disease: Secondary | ICD-10-CM | POA: Insufficient documentation

## 2016-11-17 DIAGNOSIS — Z79899 Other long term (current) drug therapy: Secondary | ICD-10-CM | POA: Diagnosis not present

## 2016-11-17 DIAGNOSIS — I252 Old myocardial infarction: Secondary | ICD-10-CM | POA: Diagnosis not present

## 2016-11-17 DIAGNOSIS — I132 Hypertensive heart and chronic kidney disease with heart failure and with stage 5 chronic kidney disease, or end stage renal disease: Secondary | ICD-10-CM | POA: Insufficient documentation

## 2016-11-17 DIAGNOSIS — D649 Anemia, unspecified: Secondary | ICD-10-CM | POA: Diagnosis not present

## 2016-11-17 DIAGNOSIS — I5021 Acute systolic (congestive) heart failure: Secondary | ICD-10-CM | POA: Diagnosis not present

## 2016-11-17 DIAGNOSIS — Z992 Dependence on renal dialysis: Secondary | ICD-10-CM | POA: Insufficient documentation

## 2016-11-17 LAB — CBC WITH DIFFERENTIAL/PLATELET
Basophils Absolute: 0 10*3/uL (ref 0.0–0.1)
Basophils Relative: 0 %
EOS PCT: 9 %
Eosinophils Absolute: 0.4 10*3/uL (ref 0.0–0.7)
HEMATOCRIT: 25 % — AB (ref 39.0–52.0)
HEMOGLOBIN: 8 g/dL — AB (ref 13.0–17.0)
LYMPHS PCT: 30 %
Lymphs Abs: 1.2 10*3/uL (ref 0.7–4.0)
MCH: 30.8 pg (ref 26.0–34.0)
MCHC: 32 g/dL (ref 30.0–36.0)
MCV: 96.2 fL (ref 78.0–100.0)
MONOS PCT: 12 %
Monocytes Absolute: 0.5 10*3/uL (ref 0.1–1.0)
NEUTROS PCT: 49 %
Neutro Abs: 2 10*3/uL (ref 1.7–7.7)
PLATELETS: 116 10*3/uL — AB (ref 150–400)
RBC: 2.6 MIL/uL — AB (ref 4.22–5.81)
RDW: 14.9 % (ref 11.5–15.5)
WBC: 4.1 10*3/uL (ref 4.0–10.5)

## 2016-11-17 LAB — BASIC METABOLIC PANEL
Anion gap: 12 (ref 5–15)
BUN: 32 mg/dL — ABNORMAL HIGH (ref 6–20)
CHLORIDE: 96 mmol/L — AB (ref 101–111)
CO2: 31 mmol/L (ref 22–32)
Calcium: 8 mg/dL — ABNORMAL LOW (ref 8.9–10.3)
Creatinine, Ser: 11.36 mg/dL — ABNORMAL HIGH (ref 0.61–1.24)
GFR calc non Af Amer: 4 mL/min — ABNORMAL LOW (ref >60)
GFR, EST AFRICAN AMERICAN: 5 mL/min — AB (ref >60)
Glucose, Bld: 114 mg/dL — ABNORMAL HIGH (ref 65–99)
POTASSIUM: 4.4 mmol/L (ref 3.5–5.1)
SODIUM: 139 mmol/L (ref 135–145)

## 2016-11-17 LAB — OCCULT BLOOD X 1 CARD TO LAB, STOOL: Fecal Occult Bld: NEGATIVE

## 2016-11-17 MED ORDER — ALBUTEROL SULFATE (2.5 MG/3ML) 0.083% IN NEBU
2.5000 mg | INHALATION_SOLUTION | Freq: Once | RESPIRATORY_TRACT | Status: AC
  Administered 2016-11-17: 2.5 mg via RESPIRATORY_TRACT
  Filled 2016-11-17: qty 3

## 2016-11-17 MED ORDER — ALBUTEROL SULFATE HFA 108 (90 BASE) MCG/ACT IN AERS: 1.0000 | INHALATION_SPRAY | Freq: Four times a day (QID) | RESPIRATORY_TRACT | 0 refills | Status: AC | PRN

## 2016-11-17 MED ORDER — IPRATROPIUM-ALBUTEROL 0.5-2.5 (3) MG/3ML IN SOLN
3.0000 mL | Freq: Once | RESPIRATORY_TRACT | Status: AC
  Administered 2016-11-17: 3 mL via RESPIRATORY_TRACT
  Filled 2016-11-17: qty 3

## 2016-11-17 MED ORDER — DOXYCYCLINE HYCLATE 100 MG PO TABS
ORAL_TABLET | ORAL | Status: AC
  Filled 2016-11-17: qty 1

## 2016-11-17 MED ORDER — DOXYCYCLINE HYCLATE 100 MG PO CAPS: 100.0000 mg | ORAL_CAPSULE | Freq: Two times a day (BID) | ORAL | 0 refills | Status: DC

## 2016-11-17 MED ORDER — DOXYCYCLINE HYCLATE 100 MG PO TABS
100.0000 mg | ORAL_TABLET | Freq: Once | ORAL | Status: AC
  Administered 2016-11-17: 100 mg via ORAL

## 2016-11-17 NOTE — Discharge Instructions (Signed)
You were seen in the ED today with difficulty breathing and fatigue. We found evidence of bronchitis and a significant drop in your blood counts. You do not require a blood transfusion at this time but this likely a large part of why you are feeling fatigued. You need to call your PCP tomorrow morning to schedule an appointment.   Return to the ED with any worsening difficulty breathing, fever, chills, chest pain, fainting, or black/bloody bowel movements.

## 2016-11-17 NOTE — Progress Notes (Signed)
As RT entered the patients room patient was taking his combivent respimat inhaler.  Patient stated that he was feeling SOB.

## 2016-11-17 NOTE — ED Triage Notes (Signed)
C/o "trouble breathing" x 1week "feels like bronchitis"-NAD

## 2016-11-17 NOTE — ED Notes (Signed)
Warm blankets given.

## 2016-11-17 NOTE — ED Notes (Signed)
Patient transported to X-ray 
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
## 2017-02-20 ENCOUNTER — Emergency Department (HOSPITAL_COMMUNITY): Payer: Medicaid Other

## 2017-02-20 ENCOUNTER — Inpatient Hospital Stay (HOSPITAL_COMMUNITY)
Admission: EM | Admit: 2017-02-20 | Discharge: 2017-02-24 | DRG: 280 | Disposition: A | Discharge status: Home / Self Care | Payer: Medicaid Other | Attending: Family Medicine | Admitting: Family Medicine

## 2017-02-20 ENCOUNTER — Encounter (HOSPITAL_COMMUNITY): Payer: Self-pay | Admitting: Emergency Medicine

## 2017-02-20 DIAGNOSIS — D696 Thrombocytopenia, unspecified: Secondary | ICD-10-CM | POA: Diagnosis present

## 2017-02-20 DIAGNOSIS — R7989 Other specified abnormal findings of blood chemistry: Secondary | ICD-10-CM

## 2017-02-20 DIAGNOSIS — I5042 Chronic combined systolic (congestive) and diastolic (congestive) heart failure: Secondary | ICD-10-CM | POA: Diagnosis not present

## 2017-02-20 DIAGNOSIS — Z881 Allergy status to other antibiotic agents status: Secondary | ICD-10-CM | POA: Diagnosis not present

## 2017-02-20 DIAGNOSIS — J449 Chronic obstructive pulmonary disease, unspecified: Secondary | ICD-10-CM | POA: Diagnosis present

## 2017-02-20 DIAGNOSIS — I2510 Atherosclerotic heart disease of native coronary artery without angina pectoris: Secondary | ICD-10-CM | POA: Diagnosis present

## 2017-02-20 DIAGNOSIS — R040 Epistaxis: Secondary | ICD-10-CM | POA: Diagnosis present

## 2017-02-20 DIAGNOSIS — D631 Anemia in chronic kidney disease: Secondary | ICD-10-CM | POA: Diagnosis present

## 2017-02-20 DIAGNOSIS — Z8249 Family history of ischemic heart disease and other diseases of the circulatory system: Secondary | ICD-10-CM

## 2017-02-20 DIAGNOSIS — B182 Chronic viral hepatitis C: Secondary | ICD-10-CM

## 2017-02-20 DIAGNOSIS — I248 Other forms of acute ischemic heart disease: Secondary | ICD-10-CM | POA: Diagnosis present

## 2017-02-20 DIAGNOSIS — T8612 Kidney transplant failure: Secondary | ICD-10-CM | POA: Diagnosis present

## 2017-02-20 DIAGNOSIS — N186 End stage renal disease: Secondary | ICD-10-CM | POA: Diagnosis present

## 2017-02-20 DIAGNOSIS — R740 Nonspecific elevation of levels of transaminase and lactic acid dehydrogenase [LDH]: Secondary | ICD-10-CM

## 2017-02-20 DIAGNOSIS — Z880 Allergy status to penicillin: Secondary | ICD-10-CM | POA: Diagnosis not present

## 2017-02-20 DIAGNOSIS — R778 Other specified abnormalities of plasma proteins: Secondary | ICD-10-CM

## 2017-02-20 DIAGNOSIS — Z992 Dependence on renal dialysis: Secondary | ICD-10-CM | POA: Diagnosis not present

## 2017-02-20 DIAGNOSIS — I252 Old myocardial infarction: Secondary | ICD-10-CM

## 2017-02-20 DIAGNOSIS — N2581 Secondary hyperparathyroidism of renal origin: Secondary | ICD-10-CM | POA: Diagnosis present

## 2017-02-20 DIAGNOSIS — M898X9 Other specified disorders of bone, unspecified site: Secondary | ICD-10-CM | POA: Diagnosis present

## 2017-02-20 DIAGNOSIS — E8770 Fluid overload, unspecified: Secondary | ICD-10-CM | POA: Diagnosis present

## 2017-02-20 DIAGNOSIS — Z9115 Patient's noncompliance with renal dialysis: Secondary | ICD-10-CM | POA: Diagnosis not present

## 2017-02-20 DIAGNOSIS — E1122 Type 2 diabetes mellitus with diabetic chronic kidney disease: Secondary | ICD-10-CM | POA: Diagnosis present

## 2017-02-20 DIAGNOSIS — F419 Anxiety disorder, unspecified: Secondary | ICD-10-CM | POA: Diagnosis present

## 2017-02-20 DIAGNOSIS — I214 Non-ST elevation (NSTEMI) myocardial infarction: Principal | ICD-10-CM | POA: Diagnosis present

## 2017-02-20 DIAGNOSIS — R7401 Elevation of levels of liver transaminase levels: Secondary | ICD-10-CM

## 2017-02-20 DIAGNOSIS — I120 Hypertensive chronic kidney disease with stage 5 chronic kidney disease or end stage renal disease: Secondary | ICD-10-CM | POA: Diagnosis present

## 2017-02-20 DIAGNOSIS — Z888 Allergy status to other drugs, medicaments and biological substances status: Secondary | ICD-10-CM | POA: Diagnosis not present

## 2017-02-20 DIAGNOSIS — Z7982 Long term (current) use of aspirin: Secondary | ICD-10-CM | POA: Diagnosis not present

## 2017-02-20 LAB — CBC WITH DIFFERENTIAL/PLATELET
BASOS PCT: 0 %
Basophils Absolute: 0 10*3/uL (ref 0.0–0.1)
EOS ABS: 0.3 10*3/uL (ref 0.0–0.7)
Eosinophils Relative: 6 %
HCT: 33.8 % — ABNORMAL LOW (ref 39.0–52.0)
HEMOGLOBIN: 11.2 g/dL — AB (ref 13.0–17.0)
Lymphocytes Relative: 31 %
Lymphs Abs: 1.5 10*3/uL (ref 0.7–4.0)
MCH: 32 pg (ref 26.0–34.0)
MCHC: 33.1 g/dL (ref 30.0–36.0)
MCV: 96.6 fL (ref 78.0–100.0)
MONOS PCT: 9 %
Monocytes Absolute: 0.4 10*3/uL (ref 0.1–1.0)
NEUTROS PCT: 53 %
Neutro Abs: 2.5 10*3/uL (ref 1.7–7.7)
Platelets: 72 10*3/uL — ABNORMAL LOW (ref 150–400)
RBC: 3.5 MIL/uL — AB (ref 4.22–5.81)
RDW: 13.4 % (ref 11.5–15.5)
WBC: 4.7 10*3/uL (ref 4.0–10.5)

## 2017-02-20 LAB — I-STAT TROPONIN, ED: TROPONIN I, POC: 0.36 ng/mL — AB (ref 0.00–0.08)

## 2017-02-20 LAB — TROPONIN I
Troponin I: 0.37 ng/mL (ref <0.03)
Troponin I: 0.33 ng/mL (ref <0.03)

## 2017-02-20 LAB — I-STAT VENOUS BLOOD GAS, ED
ACID-BASE EXCESS: 7 mmol/L — AB (ref 0.0–2.0)
Bicarbonate: 30.9 mmol/L — ABNORMAL HIGH (ref 20.0–28.0)
O2 Saturation: 79 %
TCO2: 32 mmol/L (ref 0–100)
pCO2, Ven: 41.4 mmHg — ABNORMAL LOW (ref 44.0–60.0)
pH, Ven: 7.481 — ABNORMAL HIGH (ref 7.250–7.430)
pO2, Ven: 40 mmHg (ref 32.0–45.0)

## 2017-02-20 LAB — BASIC METABOLIC PANEL
ANION GAP: 14 (ref 5–15)
BUN: 47 mg/dL — ABNORMAL HIGH (ref 6–20)
CALCIUM: 9.6 mg/dL (ref 8.9–10.3)
CO2: 29 mmol/L (ref 22–32)
Chloride: 97 mmol/L — ABNORMAL LOW (ref 101–111)
Creatinine, Ser: 13.99 mg/dL — ABNORMAL HIGH (ref 0.61–1.24)
GFR, EST AFRICAN AMERICAN: 4 mL/min — AB (ref >60)
GFR, EST NON AFRICAN AMERICAN: 3 mL/min — AB (ref >60)
Glucose, Bld: 124 mg/dL — ABNORMAL HIGH (ref 65–99)
Potassium: 4.6 mmol/L (ref 3.5–5.1)
Sodium: 140 mmol/L (ref 135–145)

## 2017-02-20 LAB — HEPATIC FUNCTION PANEL
ALBUMIN: 3.4 g/dL — AB (ref 3.5–5.0)
ALK PHOS: 46 U/L (ref 38–126)
ALT: 113 U/L — ABNORMAL HIGH (ref 17–63)
AST: 70 U/L — ABNORMAL HIGH (ref 15–41)
BILIRUBIN DIRECT: 0.2 mg/dL (ref 0.1–0.5)
Indirect Bilirubin: 0.5 mg/dL (ref 0.3–0.9)
Total Bilirubin: 0.7 mg/dL (ref 0.3–1.2)
Total Protein: 7.3 g/dL (ref 6.5–8.1)

## 2017-02-20 LAB — MAGNESIUM: MAGNESIUM: 2.6 mg/dL — AB (ref 1.7–2.4)

## 2017-02-20 LAB — PROTIME-INR
INR: 1.14
PROTHROMBIN TIME: 14.6 s (ref 11.4–15.2)

## 2017-02-20 LAB — APTT: aPTT: 42 seconds — ABNORMAL HIGH (ref 24–36)

## 2017-02-20 LAB — TSH: TSH: 3.045 u[IU]/mL (ref 0.350–4.500)

## 2017-02-20 MED ORDER — ASPIRIN 81 MG PO CHEW
324.0000 mg | CHEWABLE_TABLET | Freq: Once | ORAL | Status: AC
  Administered 2017-02-20: 324 mg via ORAL

## 2017-02-20 MED ORDER — ACETAMINOPHEN 325 MG PO TABS
650.0000 mg | ORAL_TABLET | ORAL | Status: DC | PRN
  Administered 2017-02-23: 650 mg via ORAL
  Filled 2017-02-20: qty 2

## 2017-02-20 MED ORDER — RENA-VITE PO TABS: 1.0000 | ORAL_TABLET | Freq: Every day | ORAL | Status: DC

## 2017-02-20 MED ORDER — NITROGLYCERIN 0.4 MG SL SUBL
SUBLINGUAL_TABLET | SUBLINGUAL | Status: AC
  Filled 2017-02-20: qty 1

## 2017-02-20 MED ORDER — RENA-VITE PO TABS
1.0000 | ORAL_TABLET | Freq: Every day | ORAL | Status: DC
  Administered 2017-02-20 – 2017-02-23 (×4): 1 via ORAL
  Filled 2017-02-20 (×4): qty 1

## 2017-02-20 MED ORDER — ASPIRIN 81 MG PO CHEW
CHEWABLE_TABLET | ORAL | Status: AC
  Filled 2017-02-20: qty 4

## 2017-02-20 MED ORDER — HEPARIN (PORCINE) IN NACL 100-0.45 UNIT/ML-% IJ SOLN
1100.0000 [IU]/h | INTRAMUSCULAR | Status: DC
  Administered 2017-02-20: 800 [IU]/h via INTRAVENOUS
  Filled 2017-02-20 (×2): qty 250

## 2017-02-20 MED ORDER — CINACALCET HCL 30 MG PO TABS
30.0000 mg | ORAL_TABLET | Freq: Every day | ORAL | Status: DC
  Administered 2017-02-22 – 2017-02-23 (×2): 30 mg via ORAL
  Filled 2017-02-20 (×4): qty 1

## 2017-02-20 MED ORDER — NITROGLYCERIN IN D5W 200-5 MCG/ML-% IV SOLN
0.0000 ug/min | Freq: Once | INTRAVENOUS | Status: AC
  Administered 2017-02-20: 5 ug/min via INTRAVENOUS
  Filled 2017-02-20: qty 250

## 2017-02-20 MED ORDER — IPRATROPIUM-ALBUTEROL 0.5-2.5 (3) MG/3ML IN SOLN
3.0000 mL | Freq: Three times a day (TID) | RESPIRATORY_TRACT | Status: DC
  Administered 2017-02-21: 3 mL via RESPIRATORY_TRACT
  Filled 2017-02-20 (×2): qty 3

## 2017-02-20 MED ORDER — HEPARIN SODIUM (PORCINE) 5000 UNIT/ML IJ SOLN
INTRAMUSCULAR | Status: AC
  Administered 2017-02-20: 5000 [IU]
  Filled 2017-02-20: qty 1

## 2017-02-20 MED ORDER — ATORVASTATIN CALCIUM 80 MG PO TABS: 80.0000 mg | ORAL_TABLET | Freq: Every day | ORAL | Status: DC

## 2017-02-20 MED ORDER — CALCIUM ACETATE (PHOS BINDER) 667 MG PO CAPS
667.0000 mg | ORAL_CAPSULE | Freq: Three times a day (TID) | ORAL | Status: DC
  Administered 2017-02-22 – 2017-02-24 (×3): 667 mg via ORAL
  Filled 2017-02-20 (×4): qty 1

## 2017-02-20 MED ORDER — CALCITRIOL 0.5 MCG PO CAPS
0.5000 ug | ORAL_CAPSULE | ORAL | Status: DC
  Administered 2017-02-20: 0.5 ug via ORAL

## 2017-02-20 MED ORDER — ONDANSETRON HCL 4 MG/2ML IJ SOLN
4.0000 mg | Freq: Four times a day (QID) | INTRAMUSCULAR | Status: DC | PRN
Start: 2017-02-20 — End: 2017-02-22

## 2017-02-20 MED ORDER — ALBUTEROL SULFATE (2.5 MG/3ML) 0.083% IN NEBU: 2.5000 mg | INHALATION_SOLUTION | Freq: Four times a day (QID) | RESPIRATORY_TRACT | Status: DC | PRN

## 2017-02-20 MED ORDER — ASPIRIN EC 81 MG PO TBEC
81.0000 mg | DELAYED_RELEASE_TABLET | Freq: Every day | ORAL | Status: DC
  Administered 2017-02-21 – 2017-02-24 (×4): 81 mg via ORAL
  Filled 2017-02-20 (×4): qty 1

## 2017-02-20 MED ORDER — CALCIUM ACETATE (PHOS BINDER) 667 MG PO CAPS: 667.0000 mg | ORAL_CAPSULE | Freq: Two times a day (BID) | ORAL | Status: DC | PRN

## 2017-02-20 MED ORDER — NITROGLYCERIN IN D5W 200-5 MCG/ML-% IV SOLN
0.0000 ug/min | Freq: Once | INTRAVENOUS | Status: AC
  Administered 2017-02-20: 5 ug/min via INTRAVENOUS

## 2017-02-20 MED ORDER — ALBUTEROL SULFATE HFA 108 (90 BASE) MCG/ACT IN AERS: 1.0000 | INHALATION_SPRAY | Freq: Four times a day (QID) | RESPIRATORY_TRACT | Status: DC | PRN

## 2017-02-20 MED ORDER — CARVEDILOL 12.5 MG PO TABS: 25.0000 mg | ORAL_TABLET | Freq: Two times a day (BID) | ORAL | Status: DC

## 2017-02-20 MED ORDER — NITROGLYCERIN 0.4 MG SL SUBL
0.4000 mg | SUBLINGUAL_TABLET | SUBLINGUAL | Status: DC | PRN
  Administered 2017-02-20: 0.4 mg via SUBLINGUAL

## 2017-02-20 MED ORDER — CARVEDILOL 12.5 MG PO TABS
12.5000 mg | ORAL_TABLET | Freq: Two times a day (BID) | ORAL | Status: DC
  Administered 2017-02-20 – 2017-02-24 (×8): 12.5 mg via ORAL
  Filled 2017-02-20 (×8): qty 1

## 2017-02-20 MED ORDER — CALCITRIOL 0.5 MCG PO CAPS
ORAL_CAPSULE | ORAL | Status: AC
  Administered 2017-02-20: 0.5 ug via ORAL
  Filled 2017-02-20: qty 1
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
## 2017-02-20 NOTE — ED Notes (Signed)
EDP to place ultrasound guided IV. Supplies set up at bedside.

## 2017-02-20 NOTE — ED Triage Notes (Addendum)
Pt c/o missed dialysis Friday. Pt reports he woke up Friday bleeding from mouth and nose. Pt noted to have dried blood around his mouth and nose. Pt is unable to get dialysis done at his center until Monday. Pt does not feel he can wait till Monday for dialysis. Pt also mentioned chest pain onset yesterday with some shortness of breath.

## 2017-02-20 NOTE — ED Notes (Signed)
Admitting provider at bedside. Pt. Ready for dialysis when done. Report given to [facility].

## 2017-02-20 NOTE — ED Notes (Signed)
Admitting provider at bedside.

## 2017-02-20 NOTE — Progress Notes (Signed)
ANTICOAGULATION CONSULT NOTE - Initial Consult  Pharmacy Consult for heparin Indication: chest pain/ACS  Allergies  Allergen Reactions  . Compazine [Prochlorperazine] Other (See Comments)    hallucinations  . Penicillins Shortness Of Breath    Has patient had a PCN reaction causing immediate rash, facial/tongue/throat swelling, SOB or lightheadedness with hypotension: Yes Has patient had a PCN reaction causing severe rash involving mucus membranes or skin necrosis: No Has patient had a PCN reaction that required hospitalization Yes Has patient had a PCN reaction occurring within the last 10 years: Yes If all of the above answers are "NO", then may proceed with Cephalosporin use.  . Clonidine Derivatives Other (See Comments)    Muscle spasms  . Levaquin [Levofloxacin In D5w] Diarrhea    Patient Measurements: Height: 5\' 7"  (170.2 cm) Weight: 145 lb (65.8 kg) IBW/kg (Calculated) : 66.1 Heparin Dosing Weight: 66 kg  Assessment: 51 yo M presents on 3/3 with chest pain. Did have dried blood around mouth and nose when he woke up this am. Missed HD session on Friday. No anticoag PTA. No CBC yet. Troponin elevated at 0.36. Given heparin bolus IV in ED. Pharmacy consulted to start heparin for ACS.  Goal of Therapy:  Heparin level 0.3-0.7 units/ml Monitor platelets by anticoagulation protocol: Yes   Plan:  Given 5,000 unit heparin bolus per cabinet override in the ED Start heparin gtt at 800 units/hr Check 6 hr heparin level Monitor daily heparin level, CBC, s/s of bleed  Enzo Bi, PharmD, [facility] Clinical Pharmacist Pager [phone] 02/20/2017 10:49 AM
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
## 2017-02-20 NOTE — ED Notes (Signed)
Nephrology at bedside

## 2017-02-21 ENCOUNTER — Inpatient Hospital Stay (HOSPITAL_COMMUNITY): Payer: Medicaid Other

## 2017-02-21 DIAGNOSIS — R7401 Elevation of levels of liver transaminase levels: Secondary | ICD-10-CM

## 2017-02-21 DIAGNOSIS — R740 Nonspecific elevation of levels of transaminase and lactic acid dehydrogenase [LDH]: Secondary | ICD-10-CM

## 2017-02-21 LAB — CBC
HCT: 34.2 % — ABNORMAL LOW (ref 39.0–52.0)
HEMATOCRIT: 33.8 % — AB (ref 39.0–52.0)
HEMOGLOBIN: 11.3 g/dL — AB (ref 13.0–17.0)
Hemoglobin: 11.4 g/dL — ABNORMAL LOW (ref 13.0–17.0)
MCH: 31.7 pg (ref 26.0–34.0)
MCH: 32.3 pg (ref 26.0–34.0)
MCHC: 33 g/dL (ref 30.0–36.0)
MCHC: 33.7 g/dL (ref 30.0–36.0)
MCV: 95.8 fL (ref 78.0–100.0)
MCV: 96.1 fL (ref 78.0–100.0)
PLATELETS: 89 10*3/uL — AB (ref 150–400)
Platelets: 88 10*3/uL — ABNORMAL LOW (ref 150–400)
RBC: 3.53 MIL/uL — AB (ref 4.22–5.81)
RBC: 3.56 MIL/uL — AB (ref 4.22–5.81)
RDW: 13.2 % (ref 11.5–15.5)
RDW: 13.4 % (ref 11.5–15.5)
WBC: 4.5 10*3/uL (ref 4.0–10.5)
WBC: 4.5 10*3/uL (ref 4.0–10.5)

## 2017-02-21 LAB — HEPATITIS PANEL, ACUTE
Hep A IgM: NEGATIVE
Hep B C IgM: NEGATIVE
Hepatitis B Surface Ag: NEGATIVE

## 2017-02-21 LAB — TROPONIN I
TROPONIN I: 0.27 ng/mL — AB (ref <0.03)
TROPONIN I: 0.31 ng/mL — AB (ref <0.03)
Troponin I: 0.35 ng/mL (ref <0.03)
Troponin I: 0.3 ng/mL (ref <0.03)

## 2017-02-21 LAB — HEPATIC FUNCTION PANEL
ALBUMIN: 3.2 g/dL — AB (ref 3.5–5.0)
ALK PHOS: 46 U/L (ref 38–126)
ALT: 109 U/L — ABNORMAL HIGH (ref 17–63)
AST: 66 U/L — AB (ref 15–41)
Bilirubin, Direct: 0.1 mg/dL (ref 0.1–0.5)
Indirect Bilirubin: 0.6 mg/dL (ref 0.3–0.9)
TOTAL PROTEIN: 6.9 g/dL (ref 6.5–8.1)
Total Bilirubin: 0.7 mg/dL (ref 0.3–1.2)

## 2017-02-21 LAB — RENAL FUNCTION PANEL
ALBUMIN: 3.2 g/dL — AB (ref 3.5–5.0)
ANION GAP: 13 (ref 5–15)
BUN: 20 mg/dL (ref 6–20)
CALCIUM: 8.5 mg/dL — AB (ref 8.9–10.3)
CO2: 31 mmol/L (ref 22–32)
CREATININE: 8.99 mg/dL — AB (ref 0.61–1.24)
Chloride: 95 mmol/L — ABNORMAL LOW (ref 101–111)
GFR calc Af Amer: 7 mL/min — ABNORMAL LOW (ref >60)
GFR calc non Af Amer: 6 mL/min — ABNORMAL LOW (ref >60)
GLUCOSE: 92 mg/dL (ref 65–99)
PHOSPHORUS: 3.6 mg/dL (ref 2.5–4.6)
Potassium: 4.2 mmol/L (ref 3.5–5.1)
SODIUM: 139 mmol/L (ref 135–145)

## 2017-02-21 LAB — HEPARIN LEVEL (UNFRACTIONATED): HEPARIN UNFRACTIONATED: 0.32 [IU]/mL (ref 0.30–0.70)

## 2017-02-21 LAB — HIV ANTIBODY (ROUTINE TESTING W REFLEX): HIV Screen 4th Generation wRfx: NONREACTIVE

## 2017-02-21 LAB — ECHOCARDIOGRAM COMPLETE
HEIGHTINCHES: 67 in
Weight: 2292.78 oz

## 2017-02-21 LAB — MRSA PCR SCREENING: MRSA BY PCR: NEGATIVE

## 2017-02-21 MED ORDER — HYDRALAZINE HCL 25 MG PO TABS
25.0000 mg | ORAL_TABLET | Freq: Four times a day (QID) | ORAL | Status: DC
  Administered 2017-02-21 – 2017-02-22 (×3): 25 mg via ORAL
  Filled 2017-02-21 (×3): qty 1

## 2017-02-21 MED ORDER — HEPARIN SODIUM (PORCINE) 5000 UNIT/ML IJ SOLN
5000.0000 [IU] | Freq: Three times a day (TID) | INTRAMUSCULAR | Status: DC
  Administered 2017-02-21 – 2017-02-24 (×7): 5000 [IU] via SUBCUTANEOUS
  Filled 2017-02-21 (×5): qty 1

## 2017-02-21 MED ORDER — LISINOPRIL 10 MG PO TABS
20.0000 mg | ORAL_TABLET | Freq: Every day | ORAL | Status: DC
  Administered 2017-02-21 – 2017-02-24 (×3): 20 mg via ORAL
  Filled 2017-02-21: qty 1
  Filled 2017-02-21: qty 2
  Filled 2017-02-21 (×2): qty 1

## 2017-02-21 MED ORDER — AMLODIPINE BESYLATE 10 MG PO TABS
10.0000 mg | ORAL_TABLET | Freq: Every day | ORAL | Status: DC
  Administered 2017-02-21 – 2017-02-24 (×3): 10 mg via ORAL
  Filled 2017-02-21 (×4): qty 1

## 2017-02-21 NOTE — Plan of Care (Signed)
Problem: Cardiac: Goal: Ability to achieve and maintain adequate cardiovascular perfusion will improve Outcome: Progressing Pt continues to deny chest pain at this time, will continue to monitor

## 2017-02-21 NOTE — Progress Notes (Signed)
ANTICOAGULATION CONSULT NOTE  Pharmacy Consult for Heparin Indication: chest pain/ACS  Allergies  Allergen Reactions  . Compazine [Prochlorperazine] Other (See Comments)    hallucinations  . Penicillins Shortness Of Breath    Has patient had a PCN reaction causing immediate rash, facial/tongue/throat swelling, SOB or lightheadedness with hypotension: Yes Has patient had a PCN reaction causing severe rash involving mucus membranes or skin necrosis: No Has patient had a PCN reaction that required hospitalization Yes Has patient had a PCN reaction occurring within the last 10 years: Yes If all of the above answers are "NO", then may proceed with Cephalosporin use.  . Clonidine Derivatives Other (See Comments)    Muscle spasms  . Levaquin [Levofloxacin In D5w] Diarrhea    Patient Measurements: Height: 5\' 7"  (170.2 cm) Weight: 143 lb 4.8 oz (65 kg) IBW/kg (Calculated) : 66.1  Vital Signs: Temp: 98.4 F (36.9 C) (03/03 2331) Temp Source: Oral (03/03 2331) BP: 145/64 (03/03 2331) Pulse Rate: 79 (03/03 2331)  Labs:  Recent Labs  02/20/17 1016 02/20/17 1056 02/20/17 1330 02/20/17 2137 02/21/17 0034  HGB  --  11.2*  --   --  11.4*  HCT  --  33.8*  --   --  33.8*  PLT  --  72*  --   --  89*  APTT 42*  --   --   --   --   LABPROT 14.6  --   --   --   --   INR 1.14  --   --   --   --   HEPARINUNFRC  --   --   --   --  <0.10*  CREATININE 13.99*  --   --   --   --   TROPONINI  --   --  0.37* 0.33*  --     Estimated Creatinine Clearance: 5.7 mL/min (by C-G formula based on SCr of 13.99 mg/dL (H)).  Assessment: 51 y.o. male with chest pain/elevated cardiac markers for heparin  Goal of Therapy:  Heparin level 0.3-0.7 units/ml Monitor platelets by anticoagulation protocol: Yes   Plan:  Increase Heparin 1100 units/hr Check heparin level in 8 hours.   Jeffery Riley, Jeffery Riley 02/21/2017,1:20 AM

## 2017-02-21 NOTE — Progress Notes (Signed)
  Echocardiogram 2D Echocardiogram has been performed.  Jeffery Riley 02/21/2017, 12:10 PM

## 2017-02-21 NOTE — Progress Notes (Signed)
[name] ROUNDING NOTE   Subjective:   Interval History: Jeffery Riley is a 51 y.o. 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 Disease.  Now admitted  With Pulmonary edema / Chest pain( trop. 0.36) /  (missed HD yest. Sec to" Chest pain and bleeding from nose").He is fairly compliant with HD .Usually HTN pre hd and bp comes down with uf at his out pt. Center. He is on dialysis  MWF  Schedule at [facility] . He denies any cocaine use but was positive with renal TX eval  and with recheck past December.Currently not sob and no chest  Pain  on IV NTG drip and IV Heparin drip.     Objective:  Vital signs in last 24 hours:  Temp:  [97.9 F (36.6 C)-98.4 F (36.9 C)] 97.9 F (36.6 C) (03/04 0800) Pulse Rate:  [64-79] 70 (03/04 0800) Resp:  [9-19] 18 (03/04 0800) BP: (138-192)/(64-104) 164/90 (03/04 0800) SpO2:  [94 %-99 %] 96 % (03/04 0800) Weight:  [143 lb 4.8 oz (65 kg)-147 lb 14.9 oz (67.1 kg)] 143 lb 4.8 oz (65 kg) (03/03 2331)  Weight change:  Filed Weights   02/20/17 1630 02/20/17 2028 02/20/17 2331  Weight: 147 lb 14.9 oz (67.1 kg) 143 lb 4.8 oz (65 kg) 143 lb 4.8 oz (65 kg)    Intake/Output: I/O last 3 completed shifts: In: 197.5 [I.V.:197.5] Out: 2000 [Other:2000]   Intake/Output this shift:  Total I/O In: 14 [I.V.:14] Out: -   CVS- RRR RS- CTA ABD- BS present soft non-distended EXT- no edema   Basic Metabolic Panel:  Recent Labs Lab 02/20/17 1016 02/21/17 0832  NA 140 139  K 4.6 4.2  CL 97* 95*  CO2 29 31  GLUCOSE 124* 92  BUN 47* 20  CREATININE 13.99* 8.99*  CALCIUM 9.6 8.5*  MG 2.6*  --   PHOS  --  3.6    Liver Function Tests:  Recent Labs Lab 02/20/17 1016 02/21/17 0034 02/21/17 0832  AST 70* 66*  --   ALT 113* 109*  --   ALKPHOS 46 46  --   BILITOT 0.7 0.7  --   PROT  7.3 6.9  --   ALBUMIN 3.4* 3.2* 3.2*   No results for input(s): LIPASE, AMYLASE in the last 168 hours. No results for input(s): AMMONIA in the last 168 hours.  CBC:  Recent Labs Lab 02/20/17 1056 02/21/17 0034 02/21/17 0654  WBC 4.7 4.5 4.5  NEUTROABS 2.5  --   --   HGB 11.2* 11.4* 11.3*  HCT 33.8* 33.8* 34.2*  MCV 96.6 95.8 96.1  PLT 72* 89* 88*    Cardiac Enzymes:  Recent Labs Lab 02/20/17 1330 02/20/17 2137 02/21/17 0034 02/21/17 0654  TROPONINI 0.37* 0.33* 0.31* 0.35*    BNP: Invalid input(s): POCBNP  CBG: No results for input(s): GLUCAP in the last 168 hours.  Microbiology: Results for orders placed or performed during the hospital encounter of 02/20/17  MRSA PCR Screening     Status: None   Collection Time: 02/21/17 12:52 AM  Result Value Ref Range Status   MRSA by PCR NEGATIVE NEGATIVE Final    Comment:        The GeneXpert MRSA Assay (FDA approved for NASAL specimens only), is one component of a  comprehensive MRSA colonization surveillance program. It is not intended to diagnose MRSA infection nor to guide or monitor treatment for MRSA infections.     Coagulation Studies:  Recent Labs  02/20/17 1016  LABPROT 14.6  INR 1.14    Urinalysis: No results for input(s): COLORURINE, LABSPEC, PHURINE, GLUCOSEU, HGBUR, BILIRUBINUR, KETONESUR, PROTEINUR, UROBILINOGEN, NITRITE, LEUKOCYTESUR in the last 72 hours.  Invalid input(s): APPERANCEUR    Imaging: Dg Chest 2 View  Result Date: 02/20/2017 CLINICAL DATA:  Pt c/o missed dialysis Friday. Pt reports he woke up Friday bleeding from mouth and nose. Pt noted to have dried blood around his mouth and nose. Pt is unable to get dialysis done at his center until Monday. Chest pain started yesterday with shortness of breath. EXAM: CHEST  2 VIEW COMPARISON:  11/17/2016 FINDINGS: The heart is enlarged. There are interstitial changes and Kerley B-lines consistent with interstitial pulmonary edema. There is  no overt alveolar edema. No focal consolidations or pleural effusions. IMPRESSION: Cardiomegaly and changes of interstitial pulmonary edema. Electronically Signed   By: Norva Pavlov M.D.   On: 02/20/2017 11:40     Medications:   . heparin 1,100 Units/hr (02/21/17 0800)   . aspirin EC  81 mg Oral Daily  . [START ON 02/23/2017] calcitRIOL  0.5 mcg Oral Q T,Th,Sa-HD  . calcium acetate  667 mg Oral TID WC  . carvedilol  12.5 mg Oral BID  . cinacalcet  30 mg Oral Q breakfast  . multivitamin  1 tablet Oral QHS   acetaminophen, albuterol, calcium acetate, ondansetron (ZOFRAN) IV  Assessment/ Plan:  1. Volume overload/ pulmonary edema (missed HD),  Improved  2. Chest Pain / pos trop.(ESRD pt with HF) - Appreciate cardiology help 3. ESRD -  HD  On MWF  4. Hypertension/volume  -      Coreg,Amlodiopine ,lisinopril - restart amlodipine only coreg prescribed  5. Anemia  -  HGB 11.2 no esa  On hd  6. Metabolic bone disease -  Hold binders ( phoslo as binder) with last op phos 2.9  / sensipar 30 mg qday ( Ca 8.5)  7. History of substance abuse    LOS: 1 Markita Stcharles W @TODAY @10 :13 AM

## 2017-02-21 NOTE — Progress Notes (Signed)
ANTICOAGULATION CONSULT NOTE  Pharmacy Consult for Heparin Indication: chest pain/ACS  Allergies  Allergen Reactions  . Compazine [Prochlorperazine] Other (See Comments)    hallucinations  . Penicillins Shortness Of Breath    Has patient had a PCN reaction causing immediate rash, facial/tongue/throat swelling, SOB or lightheadedness with hypotension: Yes Has patient had a PCN reaction causing severe rash involving mucus membranes or skin necrosis: No Has patient had a PCN reaction that required hospitalization Yes Has patient had a PCN reaction occurring within the last 10 years: Yes If all of the above answers are "NO", then may proceed with Cephalosporin use.  . Clonidine Derivatives Other (See Comments)    Muscle spasms  . Levaquin [Levofloxacin In D5w] Diarrhea    Patient Measurements: Height: 5\' 7"  (170.2 cm) Weight: 143 lb 4.8 oz (65 kg) IBW/kg (Calculated) : 66.1  Vital Signs: Temp: 97.9 F (36.6 C) (03/04 0800) Temp Source: Axillary (03/04 0800) BP: 164/90 (03/04 0800) Pulse Rate: 70 (03/04 0800)  Labs:  Recent Labs  02/20/17 1016  02/20/17 1056  02/20/17 2137 02/21/17 0034 02/21/17 0654 02/21/17 0832 02/21/17 0936  HGB  --   < > 11.2*  --   --  11.4* 11.3*  --   --   HCT  --   --  33.8*  --   --  33.8* 34.2*  --   --   PLT  --   --  72*  --   --  89* 88*  --   --   APTT 42*  --   --   --   --   --   --   --   --   LABPROT 14.6  --   --   --   --   --   --   --   --   INR 1.14  --   --   --   --   --   --   --   --   HEPARINUNFRC  --   --   --   --   --  <0.10*  --   --  0.32  CREATININE 13.99*  --   --   --   --   --   --  8.99*  --   TROPONINI  --   --   --   < > 0.33* 0.31* 0.35*  --   --   < > = values in this interval not displayed.  Estimated Creatinine Clearance: 8.8 mL/min (by C-G formula based on SCr of 8.99 mg/dL (H)).  Assessment: 51 y.o. male with chest pain/elevated troponins on heparin. Heparin level therapeutic after rate increase. No  bleeding per nurse. Hgb stable, platelets chronically low.   Goal of Therapy:  Heparin level 0.3-0.7 units/ml Monitor platelets by anticoagulation protocol: Yes   Plan:  Continue Heparin 1100 units/hr Check heparin level in 8 hours Daily heparin level and CBC  Alfredo Bach, BS, PharmD Clinical Pharmacy Resident [phone] (Pager) 02/21/2017 12:35 PM
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
## 2017-02-21 NOTE — Plan of Care (Signed)
Problem: Cardiac: Goal: Ability to achieve and maintain adequate cardiovascular perfusion will improve Outcome: Progressing Pt continues to deny chest pain drips are off

## 2017-02-21 NOTE — Progress Notes (Signed)
Subjective:  CHest pain improved after dialysis  Objective:  Vital Signs in the last 24 hours: BP (!) 154/85   Pulse 65   Temp 98.3 F (36.8 C) (Oral)   Resp (!) 21   Ht 5\' 7"  (1.702 m)   Wt 65 kg (143 lb 4.8 oz)   SpO2 95%   BMI 22.44 kg/m   Physical Exam: Thin BM in NAD Lungs:  Clear Cardiac:  Regular rhythm, normal S1 and S2, no S3 Abdomen:  Soft, nontender, no masses Extremities:  No edema present  Intake/Output from previous day: 03/03 0701 - 03/04 0700 In: 197.5 [I.V.:197.5] Out: 2000   Weight Filed Weights   02/20/17 1630 02/20/17 2028 02/20/17 2331  Weight: 67.1 kg (147 lb 14.9 oz) 65 kg (143 lb 4.8 oz) 65 kg (143 lb 4.8 oz)    Lab Results: Basic Metabolic Panel:  Recent Labs  33/00/76 1016 02/21/17 0832  NA 140 139  K 4.6 4.2  CL 97* 95*  CO2 29 31  GLUCOSE 124* 92  BUN 47* 20  CREATININE 13.99* 8.99*   CBC:  Recent Labs  02/20/17 1056 02/21/17 0034 02/21/17 0654  WBC 4.7 4.5 4.5  NEUTROABS 2.5  --   --   HGB 11.2* 11.4* 11.3*  HCT 33.8* 33.8* 34.2*  MCV 96.6 95.8 96.1  PLT 72* 89* 88*   Cardiac Enzymes: Troponin (Point of Care Test)  Recent Labs  02/20/17 1031  TROPIPOC 0.36*   Cardiac Panel (last 3 results)  Recent Labs  02/20/17 2137 02/21/17 0034 02/21/17 0654  TROPONINI 0.33* 0.31* 0.35*    Telemetry: Personally reviewed.  Normal sinus rhythm  EKG  Improved anterior T wave changes  Assessment/Plan:  1. Demand ischemia secondary to volume overload from missed dialysis 2. Moderate CAD 3. ESRD 4. Hypertensive heart disease 5. Thrombocytopenia.  Rec:  NO further cardiac w/u necessary.  ECHO currently pending.  He will need good BP control .  Watch platelets. Stop heparin and NTG.    Darden Palmer  MD [facility]  02/21/2017, 1:09 PM

## 2017-02-22 ENCOUNTER — Telehealth: Payer: Self-pay | Admitting: *Deleted

## 2017-02-22 DIAGNOSIS — I2510 Atherosclerotic heart disease of native coronary artery without angina pectoris: Secondary | ICD-10-CM

## 2017-02-22 LAB — CBC
HEMATOCRIT: 33.4 % — AB (ref 39.0–52.0)
Hemoglobin: 11.1 g/dL — ABNORMAL LOW (ref 13.0–17.0)
MCH: 32 pg (ref 26.0–34.0)
MCHC: 33.2 g/dL (ref 30.0–36.0)
MCV: 96.3 fL (ref 78.0–100.0)
PLATELETS: 93 10*3/uL — AB (ref 150–400)
RBC: 3.47 MIL/uL — ABNORMAL LOW (ref 4.22–5.81)
RDW: 13.1 % (ref 11.5–15.5)
WBC: 4.3 10*3/uL (ref 4.0–10.5)

## 2017-02-22 LAB — RENAL FUNCTION PANEL
ALBUMIN: 3.2 g/dL — AB (ref 3.5–5.0)
ANION GAP: 11 (ref 5–15)
BUN: 38 mg/dL — AB (ref 6–20)
CO2: 32 mmol/L (ref 22–32)
Calcium: 9 mg/dL (ref 8.9–10.3)
Chloride: 93 mmol/L — ABNORMAL LOW (ref 101–111)
Creatinine, Ser: 11.21 mg/dL — ABNORMAL HIGH (ref 0.61–1.24)
GFR calc Af Amer: 5 mL/min — ABNORMAL LOW (ref >60)
GFR calc non Af Amer: 5 mL/min — ABNORMAL LOW (ref >60)
GLUCOSE: 114 mg/dL — AB (ref 65–99)
POTASSIUM: 4 mmol/L (ref 3.5–5.1)
Phosphorus: 4.6 mg/dL (ref 2.5–4.6)
Sodium: 136 mmol/L (ref 135–145)

## 2017-02-22 LAB — TROPONIN I
Troponin I: 0.26 ng/mL (ref <0.03)
Troponin I: 0.22 ng/mL (ref <0.03)
Troponin I: 0.18 ng/mL (ref <0.03)
Troponin I: 0.18 ng/mL (ref <0.03)

## 2017-02-22 LAB — HEPATIC FUNCTION PANEL
ALBUMIN: 3.2 g/dL — AB (ref 3.5–5.0)
ALK PHOS: 52 U/L (ref 38–126)
ALT: 97 U/L — AB (ref 17–63)
AST: 66 U/L — AB (ref 15–41)
BILIRUBIN DIRECT: 0.2 mg/dL (ref 0.1–0.5)
BILIRUBIN TOTAL: 0.7 mg/dL (ref 0.3–1.2)
Indirect Bilirubin: 0.5 mg/dL (ref 0.3–0.9)
Total Protein: 6.7 g/dL (ref 6.5–8.1)

## 2017-02-22 MED ORDER — ISOSORB DINITRATE-HYDRALAZINE 20-37.5 MG PO TABS
1.0000 | ORAL_TABLET | Freq: Three times a day (TID) | ORAL | Status: DC
  Administered 2017-02-22 – 2017-02-24 (×5): 1 via ORAL
  Filled 2017-02-22 (×7): qty 1

## 2017-02-22 MED ORDER — ATORVASTATIN CALCIUM 80 MG PO TABS
80.0000 mg | ORAL_TABLET | Freq: Every day | ORAL | Status: DC
  Administered 2017-02-23: 21:00:00 80 mg via ORAL
  Filled 2017-02-22: qty 1

## 2017-02-22 NOTE — Telephone Encounter (Signed)
Looks like May is the next available though I can add some am on Wed in April if needed.  Jeffery Riley has been handling this.

## 2017-02-22 NOTE — Telephone Encounter (Signed)
Received phone call from discharging physician at [facility].  Patient currently hospitalized. He moved here recently, wants treatment for Hepatitis C. Will contact for scheduling. Andree Coss, RN

## 2017-02-22 NOTE — Discharge Summary (Signed)
Family Medicine Teaching [facility] Discharge Summary  Patient name: Jeffery Riley Medical record number: [id number] Date of birth: 01/26/1966 Age: 51 y.o. Gender: male Date of Admission: 02/20/2017  Date of Discharge: 02/24/17 Admitting Physician: Moses Manners, MD  Primary Care Provider: Roxanne Mins, PA-C Consultants: nephrology, cardiology  Indication for Hospitalization: chest pain  Discharge Diagnoses/Problem List:  HTN ESRD MWF HD 2/2 malignant HTN at age 86 s/p 2 failed kidney transplants CAD  Anxiety COPD  Disposition: home  Discharge Condition: stable  Discharge Exam:  General: Thin male lying in bed in NAD. Pleasant affect.  Cardiovascular: RRR, no murmur. No pain with palpation of chest wall. Respiratory: CTAB, easy WOB,no wheezes or rales Gastrointestinal: SNTND, +BS, no masses  Brief Hospital Course:  Patient presented with exertional chest pain (which was improved with mom's nitro prior to arrival) that was present at rest. Pain started 2 days prior to arrival and patient had to decrease his exercise from 8 to 2 miles per run due to dyspnea and chest pain. Notably, patient missed HD 1 day prior to presentation due to  In ED, EKG concerning for NSTEMI with troponin increased to 0.36, cardiology was consulted who suggested trending troponins and serial EKGs. ED MD started heparin and nitro drips, which were continued. Cardiology felt troponin bump was due to needing HD. Pain resolved with nitro drip. Patient urgently to HD from ED. Nitro and heparin drips stopped on HD#2 per cards recs, patient with no more chest pain. Cardiology resassessed on HD#3 and recommended cath given EKG changes in inferior leads and known 75% occlusion of PDA from cath 06/2016. Cath with moderate CAD - ramus 40%, prox to mid LAD 40%, prox RCA 25%, PDA 50% (appearing improved from prior study). Cardiology recommended outpatient follow up and continued medical management of CAD.  Additionally, transaminitis on CMP > Hepatitis panel with HepC which patient forgot to mention.  Issues for Follow Up:  1. Continued titration of BP control. Cardiology added bidil. 2. HCV - Patient with chronic infection. HCV RNA quant in process at time of discharge. Reports he has seen ID in [place], referred to ID here as an outpatient.  3. Medical management of CAD - outpatient cards followup and continue bidil, ASA, metoprolol, ACE.   Significant Procedures: cath 02/24/16 as above.  Significant Labs and Imaging:   Recent Labs Lab 02/22/17 0118 02/23/17 0109 02/24/17 0230  WBC 4.3 4.3 4.8  HGB 11.1* 11.4* 10.6*  HCT 33.4* 34.0* 32.0*  PLT 93* 99* 104*    Recent Labs Lab 02/20/17 1016 02/21/17 0034 02/21/17 0832 02/22/17 0118 02/22/17 0735 02/23/17 0109 02/24/17 0216  NA 140  --  139 136  --  136 134*  K 4.6  --  4.2 4.0  --  3.8 4.8  CL 97*  --  95* 93*  --  93* 94*  CO2 29  --  31 32  --  32 26  GLUCOSE 124*  --  92 114*  --  88 94  BUN 47*  --  20 38*  --  17 31*  CREATININE 13.99*  --  8.99* 11.21*  --  6.79* 9.75*  CALCIUM 9.6  --  8.5* 9.0  --  8.7* 8.4*  MG 2.6*  --   --   --   --   --   --   PHOS  --   --  3.6 4.6  --  3.4 4.9*  ALKPHOS 46 46  --   --  52  --   --   AST 70* 66*  --   --  66*  --   --   ALT 113* 109*  --   --  97*  --   --   ALBUMIN 3.4* 3.2* 3.2* 3.2* 3.2* 3.3* 3.1*   Hepatitis C positive  Results/Tests Pending at Time of Discharge: Hep C RNA quant  Discharge Medications:  Allergies as of 02/24/2017      Reactions   Compazine [prochlorperazine] Other (See Comments)   hallucinations   Penicillins Shortness Of Breath   Has patient had a PCN reaction causing immediate rash, facial/tongue/throat swelling, SOB or lightheadedness with hypotension: Yes Has patient had a PCN reaction causing severe rash involving mucus membranes or skin necrosis: No Has patient had a PCN reaction that required hospitalization Yes Has patient had a PCN  reaction occurring within the last 10 years: Yes If all of the above answers are "NO", then may proceed with Cephalosporin use.   Clonidine Derivatives Other (See Comments)   Muscle spasms   Levaquin [levofloxacin In D5w] Diarrhea      Medication List    STOP taking these medications   calcium acetate 667 MG capsule Commonly known as:  PHOSLO   doxycycline 100 MG capsule Commonly known as:  VIBRAMYCIN   isosorbide mononitrate 30 MG 24 hr tablet Commonly known as:  IMDUR     TAKE these medications   albuterol 108 (90 Base) MCG/ACT inhaler Commonly known as:  PROVENTIL HFA;VENTOLIN HFA Inhale 1-2 puffs into the lungs every 6 (six) hours as needed for wheezing or shortness of breath.   amLODipine 10 MG tablet Commonly known as:  NORVASC Take 1 tablet (10 mg total) by mouth daily.   aspirin EC 81 MG tablet Take 81 mg by mouth daily.   atorvastatin 80 MG tablet Commonly known as:  LIPITOR Take 1 tablet (80 mg total) by mouth daily at 6 PM.   calcitRIOL 0.5 MCG capsule Commonly known as:  ROCALTROL Take 1 capsule (0.5 mcg total) by mouth Every Tuesday,Thursday,and Saturday with dialysis.   carvedilol 25 MG tablet Commonly known as:  COREG Take 25 mg by mouth 2 (two) times daily.   cinacalcet 30 MG tablet Commonly known as:  SENSIPAR Take 30 mg by mouth daily.   Ipratropium-Albuterol 20-100 MCG/ACT Aers respimat Commonly known as:  COMBIVENT RESPIMAT Inhale 1 puff into the lungs 3 (three) times daily.   isosorbide-hydrALAZINE 20-37.5 MG tablet Commonly known as:  BIDIL Take 1 tablet by mouth 3 (three) times daily.   lisinopril 20 MG tablet Commonly known as:  PRINIVIL,ZESTRIL Take 20 mg by mouth daily.   metoprolol 100 MG tablet Commonly known as:  LOPRESSOR Take 1 tablet (100 mg total) by mouth 2 (two) times daily.   multivitamin Tabs tablet Take 1 tablet by mouth at bedtime.   nitroGLYCERIN 0.4 MG SL tablet Commonly known as:  NITROSTAT Place 1 tablet  (0.4 mg total) under the tongue every 5 (five) minutes as needed for chest pain.       Discharge Instructions: Please refer to Patient Instructions section of EMR for full details.  Patient was counseled important signs and symptoms that should prompt return to medical care, changes in medications, dietary instructions, activity restrictions, and follow up appointments.   Follow-Up Appointments: Follow-up Information    Roxanne Mins, PA-C. Schedule an appointment as soon as possible for a visit in 1 week(s).   Specialty:  Cardiology Why:  hospital follow  up Contact information: [facility]  [address] [name] [address] [phone]        Armanda Magic, MD. Schedule an appointment as soon as possible for a visit in 1 month(s).   Specialty:  Cardiology Why:  cardiology follow up Contact information: [address] [name] [address] [phone]           Garth Bigness, MD 02/24/2017, 12:14 PM PGY-1, [facility]

## 2017-02-22 NOTE — Progress Notes (Signed)
Progress Note  Patient Name: Jeffery Riley Date of Encounter: 02/22/2017  Primary Cardiologist: Dr. Eden Emms (never seen in clinic)  Subjective   Feeling well. No chest pain, sob or palpitations.   Inpatient Medications    Scheduled Meds: . amLODipine  10 mg Oral Daily  . aspirin EC  81 mg Oral Daily  . [START ON 02/23/2017] calcitRIOL  0.5 mcg Oral Q T,Th,Sa-HD  . calcium acetate  667 mg Oral TID WC  . carvedilol  12.5 mg Oral BID  . cinacalcet  30 mg Oral Q breakfast  . heparin subcutaneous  5,000 Units Subcutaneous Q8H  . hydrALAZINE  25 mg Oral QID  . lisinopril  20 mg Oral Daily  . multivitamin  1 tablet Oral QHS   Continuous Infusions:  PRN Meds: acetaminophen, albuterol, calcium acetate   Vital Signs    Vitals:   02/21/17 2000 02/21/17 2308 02/22/17 0348 02/22/17 0727  BP: (!) 155/86 (!) 144/69 (!) 146/76 (!) 156/86  Pulse: 72 80 71 66  Resp: 16 19 13 13   Temp: 98.2 F (36.8 C) 98.7 F (37.1 C) 98.3 F (36.8 C) 98.3 F (36.8 C)  TempSrc: Oral Oral Oral Oral  SpO2: 98% 97% 98% 97%  Weight:      Height:        Intake/Output Summary (Last 24 hours) at 02/22/17 1108 Last data filed at 02/21/17 2310  Gross per 24 hour  Intake              240 ml  Output                0 ml  Net              240 ml   Filed Weights   02/20/17 1630 02/20/17 2028 02/20/17 2331  Weight: 147 lb 14.9 oz (67.1 kg) 143 lb 4.8 oz (65 kg) 143 lb 4.8 oz (65 kg)    Telemetry    [name]  ECG    Sinus rhythm with non specific TWI  Physical Exam   GEN: No acute distress.   Neck: No JVD Cardiac: RRR, no murmurs, rubs, or gallops.  Respiratory: Clear to auscultation bilaterally. GI: Soft, nontender, non-distended  MS: No edema; No deformity. Neuro:  Nonfocal  Psych: Normal affect   Labs    Chemistry Recent Labs Lab 02/20/17 1016 02/21/17 0034 02/21/17 0832 02/22/17 0118 02/22/17 0735  NA 140  --  139 136  --   K 4.6  --  4.2 4.0  --   CL 97*  --  95* 93*  --   CO2  29  --  31 32  --   GLUCOSE 124*  --  92 114*  --   BUN 47*  --  20 38*  --   CREATININE 13.99*  --  8.99* 11.21*  --   CALCIUM 9.6  --  8.5* 9.0  --   PROT 7.3 6.9  --   --  6.7  ALBUMIN 3.4* 3.2* 3.2* 3.2* 3.2*  AST 70* 66*  --   --  66*  ALT 113* 109*  --   --  97*  ALKPHOS 46 46  --   --  52  BILITOT 0.7 0.7  --   --  0.7  GFRNONAA 3*  --  6* 5*  --   GFRAA 4*  --  7* 5*  --   ANIONGAP 14  --  13 11  --  Hematology Recent Labs Lab 02/21/17 0034 02/21/17 0654 02/22/17 0118  WBC 4.5 4.5 4.3  RBC 3.53* 3.56* 3.47*  HGB 11.4* 11.3* 11.1*  HCT 33.8* 34.2* 33.4*  MCV 95.8 96.1 96.3  MCH 32.3 31.7 32.0  MCHC 33.7 33.0 33.2  RDW 13.4 13.2 13.1  PLT 89* 88* 93*    Cardiac Enzymes Recent Labs Lab 02/21/17 1249 02/21/17 1850 02/22/17 0118 02/22/17 0735  TROPONINI 0.30* 0.27* 0.26* 0.22*    Recent Labs Lab 02/20/17 1031  TROPIPOC 0.36*     BNPNo results for input(s): BNP, PROBNP in the last 168 hours.   DDimer No results for input(s): DDIMER in the last 168 hours.   Radiology    Dg Chest 2 View  Result Date: 02/20/2017 CLINICAL DATA:  Pt c/o missed dialysis Friday. Pt reports he woke up Friday bleeding from mouth and nose. Pt noted to have dried blood around his mouth and nose. Pt is unable to get dialysis done at his center until Monday. Chest pain started yesterday with shortness of breath. EXAM: CHEST  2 VIEW COMPARISON:  11/17/2016 FINDINGS: The heart is enlarged. There are interstitial changes and Kerley B-lines consistent with interstitial pulmonary edema. There is no overt alveolar edema. No focal consolidations or pleural effusions. IMPRESSION: Cardiomegaly and changes of interstitial pulmonary edema. Electronically Signed   By: Norva Pavlov M.D.   On: 02/20/2017 11:40    Cardiac Studies   Echo 02/21/17 Study Conclusions  - Left ventricle: The cavity size was normal. Wall thickness was   increased in a pattern of severe LVH. Systolic function  was   mildly reduced. The estimated ejection fraction was in the range   of 45% to 50%. Wall motion was normal; there were no regional   wall motion abnormalities. - Mitral valve: There was mild regurgitation. - Left atrium: The atrium was moderately dilated.  Patient Profile     Mr. 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   Assessment & Plan     1. Elevated troponin/Chest pain  - Peak 0.37 at admission then trending down. EKG with new changes. Stopped IV heparin and NTG yesterday. Chest pain resolved after emergent dialysis ( he missed dialysis as he fall a sleep). Demand ischemia secondary to volume overload from missed dialysis - He runs for about 30 minutes every other day without dyspnea or chest pain. Echo this admission showed stable low EF of 45-50%. No sign of CHF.    2. Non obstructive CAD He was seen last seen by cardiologist July 2017 and had an abnormal myocardial perfusion scan and eventually under went catheterization with findings of moderate LAD disease, ostial PDA stenosis and mild circumflex stenosis.   3. HTN - Minimally elevated. Consider up titration of BB or hydralazine.   4. ESRD - Likely dialysis today.   Dispo: He would like regular cardiology follow up. Will make appointment. Likely discharge later today after dialysis. Continue exercise regimen--> if on going chest pain, will consider ischemic eval or medical management with [facility].   Signed, Manson Passey, PA  02/22/2017, 11:08 AM

## 2017-02-22 NOTE — Procedures (Signed)
Doing well on HD.  Per primary team, he is ready for dc after HD today.  No new suggestions.    I was present at this dialysis session, have reviewed the session itself and made  appropriate changes Vinson Moselle MD [facility] pager [phone]   02/22/2017, 2:39 PM

## 2017-02-22 NOTE — Care Management Note (Addendum)
Case Management Note  Patient Details  Name: Jeffery Riley MRN: [id number] Date of Birth: 27-Jul-1966  Subjective/Objective:    From home with he runs 30 mins qod, presents with chest pain, has ESRD, missed HD, started on nitro and hep drip, trops mildly elevated . Receiving HD,  Hs decided to go for cath. NCM will cont to follow for dc needs.                Action/Plan:   Expected Discharge Date:                  Expected Discharge Plan:  Home/Self Care  In-House Referral:     Discharge planning Services  CM Consult  Post Acute Care Choice:    Choice offered to:     DME Arranged:    DME Agency:     HH Arranged:    HH Agency:     Status of Service:  Completed, signed off  If discussed at [employer] of Stay Meetings, dates discussed:    Additional Comments:  Leone Haven, RN 02/22/2017, 5:20 PM
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
## 2017-02-22 NOTE — Progress Notes (Signed)
Transitions of Care Pharmacy Note  Plan:  Educated on blood pressure medications, atorvastatin Addressed concerns regarding blood pressure medications causing sharp pain in neck Recommend re-starting atorvastatin, needs new script  --------------------------------------------- Jeffery Riley is an 51 y.o. male who presents with a chief complaint of exertional chest pain after missing HD x1 day. In anticipation of discharge, pharmacy has reviewed this patient's prior to admission medication history, as well as current inpatient medications listed per the [facility].  Current medication indications, dosing, frequency, and notable side effects reviewed with patient. patient verbalized understanding of current inpatient medication regimen and is aware that the After Visit Summary when presented, will represent the most accurate medication list at discharge.   Jeffery Riley expressed concerns regarding a sharp pain in his neck following his usual blood pressure medications this AM. It was relieved by a heat pad. I discussed with him that this blood pressure meds were probably not the cause, especially since he has been taking them for a long time. Jeffery Riley also stopped taking his atorvastatin because it couldn't be refilled. I told him that we could send a new script.   Assessment: Understanding of regimen: good Understanding of indications: good Potential of compliance: good Barriers to Obtaining Medications: No  Patient instructed to contact inpatient pharmacy team with further questions or concerns if needed.    Time spent preparing for discharge counseling: 10 Time spent counseling patient: 10   Thank you for allowing pharmacy to be a part of this patient's care.  Jeffery Riley), PharmD  PGY1 Pharmacy Resident Pager: [phone] 02/22/2017 12:22 PM

## 2017-02-23 ENCOUNTER — Encounter (HOSPITAL_COMMUNITY)
Admission: EM | Disposition: A | Discharge status: Home / Self Care | Payer: Self-pay | Source: Home / Self Care | Attending: Family Medicine

## 2017-02-23 DIAGNOSIS — R778 Other specified abnormalities of plasma proteins: Secondary | ICD-10-CM

## 2017-02-23 DIAGNOSIS — I2510 Atherosclerotic heart disease of native coronary artery without angina pectoris: Secondary | ICD-10-CM

## 2017-02-23 DIAGNOSIS — B182 Chronic viral hepatitis C: Secondary | ICD-10-CM

## 2017-02-23 DIAGNOSIS — R7989 Other specified abnormal findings of blood chemistry: Secondary | ICD-10-CM

## 2017-02-23 HISTORY — PX: LEFT HEART CATH AND CORONARY ANGIOGRAPHY: CATH118249

## 2017-02-23 LAB — RENAL FUNCTION PANEL
ALBUMIN: 3.3 g/dL — AB (ref 3.5–5.0)
Anion gap: 11 (ref 5–15)
BUN: 17 mg/dL (ref 6–20)
CHLORIDE: 93 mmol/L — AB (ref 101–111)
CO2: 32 mmol/L (ref 22–32)
CREATININE: 6.79 mg/dL — AB (ref 0.61–1.24)
Calcium: 8.7 mg/dL — ABNORMAL LOW (ref 8.9–10.3)
GFR calc Af Amer: 10 mL/min — ABNORMAL LOW (ref >60)
GFR, EST NON AFRICAN AMERICAN: 8 mL/min — AB (ref >60)
Glucose, Bld: 88 mg/dL (ref 65–99)
PHOSPHORUS: 3.4 mg/dL (ref 2.5–4.6)
Potassium: 3.8 mmol/L (ref 3.5–5.1)
Sodium: 136 mmol/L (ref 135–145)

## 2017-02-23 LAB — CBC
HCT: 34 % — ABNORMAL LOW (ref 39.0–52.0)
Hemoglobin: 11.4 g/dL — ABNORMAL LOW (ref 13.0–17.0)
MCH: 31.8 pg (ref 26.0–34.0)
MCHC: 33.5 g/dL (ref 30.0–36.0)
MCV: 95 fL (ref 78.0–100.0)
PLATELETS: 99 10*3/uL — AB (ref 150–400)
RBC: 3.58 MIL/uL — AB (ref 4.22–5.81)
RDW: 13 % (ref 11.5–15.5)
WBC: 4.3 10*3/uL (ref 4.0–10.5)

## 2017-02-23 LAB — TROPONIN I
TROPONIN I: 0.17 ng/mL — AB (ref <0.03)
TROPONIN I: 0.14 ng/mL — AB (ref <0.03)

## 2017-02-23 SURGERY — LEFT HEART CATH AND CORONARY ANGIOGRAPHY

## 2017-02-23 MED ORDER — ONDANSETRON HCL 4 MG/2ML IJ SOLN: 4.0000 mg | Freq: Four times a day (QID) | INTRAMUSCULAR | Status: DC | PRN

## 2017-02-23 MED ORDER — SODIUM CHLORIDE 0.9% FLUSH
3.0000 mL | Freq: Two times a day (BID) | INTRAVENOUS | Status: DC
  Administered 2017-02-23: 3 mL via INTRAVENOUS

## 2017-02-23 MED ORDER — ASPIRIN 81 MG PO CHEW
81.0000 mg | CHEWABLE_TABLET | ORAL | Status: AC
  Filled 2017-02-23: qty 1

## 2017-02-23 MED ORDER — SODIUM CHLORIDE 0.9% FLUSH: 3.0000 mL | INTRAVENOUS | Status: DC | PRN

## 2017-02-23 MED ORDER — LIDOCAINE HCL (PF) 1 % IJ SOLN
INTRAMUSCULAR | Status: AC
  Filled 2017-02-23: qty 30

## 2017-02-23 MED ORDER — FENTANYL CITRATE (PF) 100 MCG/2ML IJ SOLN
INTRAMUSCULAR | Status: DC | PRN
  Administered 2017-02-23: 25 ug via INTRAVENOUS

## 2017-02-23 MED ORDER — MIDAZOLAM HCL 2 MG/2ML IJ SOLN
INTRAMUSCULAR | Status: AC
  Filled 2017-02-23: qty 2

## 2017-02-23 MED ORDER — ASPIRIN 81 MG PO CHEW: 81.0000 mg | CHEWABLE_TABLET | ORAL | Status: DC

## 2017-02-23 MED ORDER — MIDAZOLAM HCL 2 MG/2ML IJ SOLN
INTRAMUSCULAR | Status: DC | PRN
  Administered 2017-02-23 (×2): 1 mg via INTRAVENOUS

## 2017-02-23 MED ORDER — SODIUM CHLORIDE 0.9 % IV SOLN: 250.0000 mL | INTRAVENOUS | Status: DC | PRN

## 2017-02-23 MED ORDER — FENTANYL CITRATE (PF) 100 MCG/2ML IJ SOLN
INTRAMUSCULAR | Status: AC
  Filled 2017-02-23: qty 2

## 2017-02-23 MED ORDER — IOPAMIDOL (ISOVUE-370) INJECTION 76%
INTRAVENOUS | Status: DC | PRN
  Administered 2017-02-23: 30 mL via INTRA_ARTERIAL

## 2017-02-23 MED ORDER — HEPARIN (PORCINE) IN NACL 2-0.9 UNIT/ML-% IJ SOLN
INTRAMUSCULAR | Status: AC
  Filled 2017-02-23: qty 1000

## 2017-02-23 MED ORDER — SODIUM CHLORIDE 0.9% FLUSH
3.0000 mL | Freq: Two times a day (BID) | INTRAVENOUS | Status: DC
  Administered 2017-02-24: 3 mL via INTRAVENOUS

## 2017-02-23 MED ORDER — HEPARIN (PORCINE) IN NACL 2-0.9 UNIT/ML-% IJ SOLN
INTRAMUSCULAR | Status: DC | PRN
  Administered 2017-02-23: 1000 mL

## 2017-02-23 MED ORDER — IOPAMIDOL (ISOVUE-370) INJECTION 76%
INTRAVENOUS | Status: AC
  Filled 2017-02-23: qty 100

## 2017-02-23 MED ORDER — ACETAMINOPHEN 325 MG PO TABS: 650.0000 mg | ORAL_TABLET | ORAL | Status: DC | PRN

## 2017-02-23 MED ORDER — LIDOCAINE HCL (PF) 1 % IJ SOLN
INTRAMUSCULAR | Status: DC | PRN
  Administered 2017-02-23: 10 mL

## 2017-02-23 MED ORDER — SODIUM CHLORIDE 0.9 % IV SOLN
INTRAVENOUS | Status: DC
  Administered 2017-02-23: 12:00:00 via INTRAVENOUS

## 2017-02-23 SURGICAL SUPPLY — 6 items
CATH INFINITI 5FR MULTPACK ANG (CATHETERS) ×3 IMPLANT
KIT HEART LEFT (KITS) ×3 IMPLANT
PACK CARDIAC CATHETERIZATION (CUSTOM PROCEDURE TRAY) ×3 IMPLANT
SHEATH PINNACLE 5F 10CM (SHEATH) ×3 IMPLANT
TRANSDUCER W/STOPCOCK (MISCELLANEOUS) ×3 IMPLANT
WIRE EMERALD 3MM-J .035X150CM (WIRE) ×3 IMPLANT
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
## 2017-02-23 NOTE — Progress Notes (Signed)
Pt. sent to cardiac cath lab. Belongings were gathered from the room and placed in two "patient belongings" bags. The bags were labeled with the pt.'s name and placed securely near the nurses station. Waiting for new room assignment.

## 2017-02-23 NOTE — Progress Notes (Signed)
Site area: Right groin a 5 french arterial sheath was removed  Site Prior to Removal:  Level 0  Pressure Applied For 15 MINUTES    Bedrest Beginning at 1715p  Manual:   Yes.    Patient Status During Pull:  stable  Post Pull Groin Site:  Level 0  Post Pull Instructions Given:  Yes.    Post Pull Pulses Present:  Yes.    Dressing Applied:  Yes.    Comments:  VS remain stable during sheath pull

## 2017-02-23 NOTE — Progress Notes (Signed)
[facility] Progress Note   Subjective: no c/o's  Vitals:   02/23/17 0038 02/23/17 0344 02/23/17 0710 02/23/17 0734  BP: (!) 146/81 128/71  (!) 146/83  Pulse: 76 76  60  Resp: 12 13  11   Temp: 97.9 F (36.6 C) 98.8 F (37.1 C)  98.1 F (36.7 C)  TempSrc: Oral Oral  Oral  SpO2: 99% 97%  98%  Weight:   65.7 kg (144 lb 13.5 oz)   Height:        Inpatient medications: . amLODipine  10 mg Oral Daily  . aspirin EC  81 mg Oral Daily  . atorvastatin  80 mg Oral q1800  . calcitRIOL  0.5 mcg Oral Q T,Th,Sa-HD  . calcium acetate  667 mg Oral TID WC  . carvedilol  12.5 mg Oral BID  . cinacalcet  30 mg Oral Q breakfast  . heparin subcutaneous  5,000 Units Subcutaneous Q8H  . isosorbide-hydrALAZINE  1 tablet Oral TID  . lisinopril  20 mg Oral Daily  . multivitamin  1 tablet Oral QHS  . sodium chloride flush  3 mL Intravenous Q12H   . sodium chloride     sodium chloride, acetaminophen, albuterol, calcium acetate, sodium chloride flush  Exam: 1. Vol overload/ pulm edema - due to missed HD, resolved 2. Chest pain - for heart cath today 3. HTN - long-standing issue, cont meds 4. Anemia of CKD - Hb 11.2, no esa for now 5. MBD - holding pholo 6. Hx substance abuse  CXR 3/3 - mild IS edema   Dialysis: [place]/ [facility] MWF  3h   63.5kg  2/2.25 bath Hep none RUA AVG     No ESA , No Vd, no Fe  Other  Op labs ca 10.5 phos 2.9  pth 17   Assessment/Plan 1. Volume overload/ pulmonary edema (missed HD) - not very impressive CXR on admission.  2. Chest Pain / pos trop.(ESRD pt with HF) - for heart cath today, last cath 7/17 3. ESRD -  HD  On MWF  4. Hypertension/volume  - bp coming down  In [place]  With IV ntg / uf on hd/ prob needs lower edw / bp meds = Coreg,Amlodiopine ,lisinopril  5. Anemia  -  HGB 11.2 no esa  On hd  6. Metabolic bone disease -  Hold binders ( phoslo as binder) with last op phos 2.9  / sensipar 30 mg qday 7. HO substance abuse= check  serum dru screen pre hd    Plan - For cath today and HD tomorrow.    Vinson Moselle MD [facility] pager [phone]   02/23/2017, 10:52 AM    Recent Labs Lab 02/21/17 0832 02/22/17 0118 02/23/17 0109  NA 139 136 136  K 4.2 4.0 3.8  CL 95* 93* 93*  CO2 31 32 32  GLUCOSE 92 114* 88  BUN 20 38* 17  CREATININE 8.99* 11.21* 6.79*  CALCIUM 8.5* 9.0 8.7*  PHOS 3.6 4.6 3.4    Recent Labs Lab 02/20/17 1016 02/21/17 0034  02/22/17 0118 02/22/17 0735 02/23/17 0109  AST 70* 66*  --   --  66*  --   ALT 113* 109*  --   --  97*  --   ALKPHOS 46 46  --   --  52  --   BILITOT 0.7 0.7  --   --  0.7  --   PROT 7.3 6.9  --   --  6.7  --  ALBUMIN 3.4* 3.2*  < > 3.2* 3.2* 3.3*  < > = values in this interval not displayed.  Recent Labs Lab 02/20/17 1056  02/21/17 0654 02/22/17 0118 02/23/17 0109  WBC 4.7  < > 4.5 4.3 4.3  NEUTROABS 2.5  --   --   --   --   HGB 11.2*  < > 11.3* 11.1* 11.4*  HCT 33.8*  < > 34.2* 33.4* 34.0*  MCV 96.6  < > 96.1 96.3 95.0  PLT 72*  < > 88* 93* 99*  < > = values in this interval not displayed. Iron/TIBC/Ferritin/ %Sat No results found for: IRON, TIBC, FERRITIN, IRONPCTSAT

## 2017-02-23 NOTE — Progress Notes (Signed)
Progress Note  Patient Name: Jeffery Riley Date of Encounter: 02/23/2017  Primary Cardiologist: Dr. Eden Emms (never seen in clinic)  Subjective   Feeling well. No chest pain, sob or palpitations. For cath today.   Inpatient Medications    Scheduled Meds: . amLODipine  10 mg Oral Daily  . aspirin EC  81 mg Oral Daily  . atorvastatin  80 mg Oral q1800  . calcitRIOL  0.5 mcg Oral Q T,Th,Sa-HD  . calcium acetate  667 mg Oral TID WC  . carvedilol  12.5 mg Oral BID  . cinacalcet  30 mg Oral Q breakfast  . heparin subcutaneous  5,000 Units Subcutaneous Q8H  . isosorbide-hydrALAZINE  1 tablet Oral TID  . lisinopril  20 mg Oral Daily  . multivitamin  1 tablet Oral QHS  . sodium chloride flush  3 mL Intravenous Q12H   Continuous Infusions: . sodium chloride     PRN Meds: sodium chloride, acetaminophen, albuterol, calcium acetate, sodium chloride flush   Vital Signs    Vitals:   02/23/17 0038 02/23/17 0344 02/23/17 0710 02/23/17 0734  BP: (!) 146/81 128/71  (!) 146/83  Pulse: 76 76  60  Resp: 12 13  11   Temp: 97.9 F (36.6 C) 98.8 F (37.1 C)  98.1 F (36.7 C)  TempSrc: Oral Oral  Oral  SpO2: 99% 97%  98%  Weight:   144 lb 13.5 oz (65.7 kg)   Height:        Intake/Output Summary (Last 24 hours) at 02/23/17 0945 Last data filed at 02/23/17 0000  Gross per 24 hour  Intake              360 ml  Output             2113 ml  Net            -1753 ml   Filed Weights   02/22/17 1411 02/22/17 1821 02/23/17 0710  Weight: 145 lb 11.6 oz (66.1 kg) 140 lb 14 oz (63.9 kg) 144 lb 13.5 oz (65.7 kg)    Telemetry    Sinus rhythm - personally reviewed    ECG    Sinus rhythm with non specific TWI -  personally reviewed   Physical Exam   GEN: No acute distress.   Neck: No JVD Cardiac: RRR, no murmurs, rubs, or gallops.  Respiratory: Clear to auscultation bilaterally. GI: Soft, nontender, non-distended  MS: No edema; No deformity. Neuro:  Nonfocal  Psych: Normal affect    Labs    Chemistry  Recent Labs Lab 02/20/17 1016 02/21/17 0034 02/21/17 8341 02/22/17 0118 02/22/17 0735 02/23/17 0109  NA 140  --  139 136  --  136  K 4.6  --  4.2 4.0  --  3.8  CL 97*  --  95* 93*  --  93*  CO2 29  --  31 32  --  32  GLUCOSE 124*  --  92 114*  --  88  BUN 47*  --  20 38*  --  17  CREATININE 13.99*  --  8.99* 11.21*  --  6.79*  CALCIUM 9.6  --  8.5* 9.0  --  8.7*  PROT 7.3 6.9  --   --  6.7  --   ALBUMIN 3.4* 3.2* 3.2* 3.2* 3.2* 3.3*  AST 70* 66*  --   --  66*  --   ALT 113* 109*  --   --  97*  --   ALKPHOS  46 46  --   --  52  --   BILITOT 0.7 0.7  --   --  0.7  --   GFRNONAA 3*  --  6* 5*  --  8*  GFRAA 4*  --  7* 5*  --  10*  ANIONGAP 14  --  13 11  --  11     Hematology  Recent Labs Lab 02/21/17 0654 02/22/17 0118 02/23/17 0109  WBC 4.5 4.3 4.3  RBC 3.56* 3.47* 3.58*  HGB 11.3* 11.1* 11.4*  HCT 34.2* 33.4* 34.0*  MCV 96.1 96.3 95.0  MCH 31.7 32.0 31.8  MCHC 33.0 33.2 33.5  RDW 13.2 13.1 13.0  PLT 88* 93* 99*    Cardiac Enzymes  Recent Labs Lab 02/22/17 1227 02/22/17 1956 02/23/17 0109 02/23/17 0736  TROPONINI 0.18* 0.18* 0.17* 0.14*     Recent Labs Lab 02/20/17 1031  TROPIPOC 0.36*     BNPNo results for input(s): BNP, PROBNP in the last 168 hours.   DDimer No results for input(s): DDIMER in the last 168 hours.   Radiology    No results found.  Cardiac Studies   Echo 02/21/17 Study Conclusions  - Left ventricle: The cavity size was normal. Wall thickness was   increased in a pattern of severe LVH. Systolic function was   mildly reduced. The estimated ejection fraction was in the range   of 45% to 50%. Wall motion was normal; there were no regional   wall motion abnormalities. - Mitral valve: There was mild regurgitation. - Left atrium: The atrium was moderately dilated.  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   Assessment & Plan     1. Elevated troponin/Chest pain  - Peak 0.37 at admission then trending down. EKG with new changes. Stopped IV heparin and NTG yesterday. Chest pain resolved after emergent dialysis ( he missed dialysis as he fall a sleep). ? demand ischemia secondary to volume overload from missed dialysis. However he unable to run properly recently. He is for cath today redefine coronary anatomy.  -  Echo this admission showed stable low EF of 45-50%. No sign of CHF.   2. Non obstructive CAD He was seen last seen by cardiologist July 2017 and had an abnormal myocardial perfusion scan and eventually under went catheterization with findings of moderate LAD disease, ostial PDA stenosis and mild circumflex stenosis.   3. HTN - Changed his hydralazine to Bidil 20/37.5mg  TID. BP some what improved. Continue BB and Norvasc.   4. ESRD - on HD per nephrology.   Dispo: He would like regular cardiology follow up. Will make appointment pending cath.   Signed, Manson Passey, PA  02/23/2017, 9:45 AM

## 2017-02-23 NOTE — Interval H&P Note (Signed)
Cath Lab Visit (complete for each Cath Lab visit)  Clinical Evaluation Leading to the Procedure:   ACS: Yes.    Non-ACS:    Anginal Classification: CCS IV  Anti-ischemic medical therapy: Minimal Therapy (1 class of medications)  Non-Invasive Test Results: No non-invasive testing performed  Prior CABG: No previous CABG      History and Physical Interval Note:  02/23/2017 4:03 PM  Jeffery Riley  has presented today for surgery, with the diagnosis of cp  The various methods of treatment have been discussed with the patient and family. After consideration of risks, benefits and other options for treatment, the patient has consented to  Procedure(s): Left Heart Cath and Coronary Angiography ([name]) as a surgical intervention .  The patient's history has been reviewed, patient examined, no change in status, stable for surgery.  I have reviewed the patient's chart and labs.  Questions were answered to the patient's satisfaction.     Lance Muss

## 2017-02-23 NOTE — H&P (View-Only) (Signed)
 Progress Note  Patient Name: Jeffery Riley Date of Encounter: 02/23/2017  Primary Cardiologist: Dr. Nishan (never seen in clinic)  Subjective   Feeling well. No chest pain, sob or palpitations. For cath today.   Inpatient Medications    Scheduled Meds: . amLODipine  10 mg Oral Daily  . aspirin EC  81 mg Oral Daily  . atorvastatin  80 mg Oral q1800  . calcitRIOL  0.5 mcg Oral Q T,Th,Sa-HD  . calcium acetate  667 mg Oral TID WC  . carvedilol  12.5 mg Oral BID  . cinacalcet  30 mg Oral Q breakfast  . heparin subcutaneous  5,000 Units Subcutaneous Q8H  . isosorbide-hydrALAZINE  1 tablet Oral TID  . lisinopril  20 mg Oral Daily  . multivitamin  1 tablet Oral QHS  . sodium chloride flush  3 mL Intravenous Q12H   Continuous Infusions: . sodium chloride     PRN Meds: sodium chloride, acetaminophen, albuterol, calcium acetate, sodium chloride flush   Vital Signs    Vitals:   02/23/17 0038 02/23/17 0344 02/23/17 0710 02/23/17 0734  BP: (!) 146/81 128/71  (!) 146/83  Pulse: 76 76  60  Resp: 12 13  11  Temp: 97.9 F (36.6 C) 98.8 F (37.1 C)  98.1 F (36.7 C)  TempSrc: Oral Oral  Oral  SpO2: 99% 97%  98%  Weight:   144 lb 13.5 oz (65.7 kg)   Height:        Intake/Output Summary (Last 24 hours) at 02/23/17 0945 Last data filed at 02/23/17 0000  Gross per 24 hour  Intake              360 ml  Output             2113 ml  Net            -1753 ml   Filed Weights   02/22/17 1411 02/22/17 1821 02/23/17 0710  Weight: 145 lb 11.6 oz (66.1 kg) 140 lb 14 oz (63.9 kg) 144 lb 13.5 oz (65.7 kg)    Telemetry    Sinus rhythm - personally reviewed    ECG    Sinus rhythm with non specific TWI -  personally reviewed   Physical Exam   GEN: No acute distress.   Neck: No JVD Cardiac: RRR, no murmurs, rubs, or gallops.  Respiratory: Clear to auscultation bilaterally. GI: Soft, nontender, non-distended  MS: No edema; No deformity. Neuro:  Nonfocal  Psych: Normal affect    Labs    Chemistry  Recent Labs Lab 02/20/17 1016 02/21/17 0034 02/21/17 0832 02/22/17 0118 02/22/17 0735 02/23/17 0109  NA 140  --  139 136  --  136  K 4.6  --  4.2 4.0  --  3.8  CL 97*  --  95* 93*  --  93*  CO2 29  --  31 32  --  32  GLUCOSE 124*  --  92 114*  --  88  BUN 47*  --  20 38*  --  17  CREATININE 13.99*  --  8.99* 11.21*  --  6.79*  CALCIUM 9.6  --  8.5* 9.0  --  8.7*  PROT 7.3 6.9  --   --  6.7  --   ALBUMIN 3.4* 3.2* 3.2* 3.2* 3.2* 3.3*  AST 70* 66*  --   --  66*  --   ALT 113* 109*  --   --  97*  --   ALKPHOS   46 46  --   --  52  --   BILITOT 0.7 0.7  --   --  0.7  --   GFRNONAA 3*  --  6* 5*  --  8*  GFRAA 4*  --  7* 5*  --  10*  ANIONGAP 14  --  13 11  --  11     Hematology  Recent Labs Lab 02/21/17 0654 02/22/17 0118 02/23/17 0109  WBC 4.5 4.3 4.3  RBC 3.56* 3.47* 3.58*  HGB 11.3* 11.1* 11.4*  HCT 34.2* 33.4* 34.0*  MCV 96.1 96.3 95.0  MCH 31.7 32.0 31.8  MCHC 33.0 33.2 33.5  RDW 13.2 13.1 13.0  PLT 88* 93* 99*    Cardiac Enzymes  Recent Labs Lab 02/22/17 1227 02/22/17 1956 02/23/17 0109 02/23/17 0736  TROPONINI 0.18* 0.18* 0.17* 0.14*     Recent Labs Lab 02/20/17 1031  TROPIPOC 0.36*     BNPNo results for input(s): BNP, PROBNP in the last 168 hours.   DDimer No results for input(s): DDIMER in the last 168 hours.   Radiology    No results found.  Cardiac Studies   Echo 02/21/17 Study Conclusions  - Left ventricle: The cavity size was normal. Wall thickness was   increased in a pattern of severe LVH. Systolic function was   mildly reduced. The estimated ejection fraction was in the range   of 45% to 50%. Wall motion was normal; there were no regional   wall motion abnormalities. - Mitral valve: There was mild regurgitation. - Left atrium: The atrium was moderately dilated.  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   Assessment & Plan     1. Elevated troponin/Chest pain  - Peak 0.37 at admission then trending down. EKG with new changes. Stopped IV heparin and NTG yesterday. Chest pain resolved after emergent dialysis ( he missed dialysis as he fall a sleep). ? demand ischemia secondary to volume overload from missed dialysis. However he unable to run properly recently. He is for cath today redefine coronary anatomy.  -  Echo this admission showed stable low EF of 45-50%. No sign of CHF.   2. Non obstructive CAD He was seen last seen by cardiologist July 2017 and had an abnormal myocardial perfusion scan and eventually under went catheterization with findings of moderate LAD disease, ostial PDA stenosis and mild circumflex stenosis.   3. HTN - Changed his hydralazine to Bidil 20/37.5mg TID. BP some what improved. Continue BB and Norvasc.   4. ESRD - on HD per nephrology.   Dispo: He would like regular cardiology follow up. Will make appointment pending cath.   Signed, Quina Wilbourne, PA  02/23/2017, 9:45 AM   

## 2017-02-23 NOTE — Progress Notes (Signed)
Cath results noted.  Moderate nonobstructive CAD.  Continue aggressive medical therapy.  No further cardiac recs at this time.  Will sign off. Please have patient followup in office .

## 2017-02-24 ENCOUNTER — Encounter (HOSPITAL_COMMUNITY): Payer: Self-pay | Admitting: Interventional Cardiology

## 2017-02-24 LAB — CBC
HEMATOCRIT: 32 % — AB (ref 39.0–52.0)
HEMOGLOBIN: 10.6 g/dL — AB (ref 13.0–17.0)
MCH: 31.5 pg (ref 26.0–34.0)
MCHC: 33.1 g/dL (ref 30.0–36.0)
MCV: 95.2 fL (ref 78.0–100.0)
Platelets: 104 10*3/uL — ABNORMAL LOW (ref 150–400)
RBC: 3.36 MIL/uL — ABNORMAL LOW (ref 4.22–5.81)
RDW: 13.1 % (ref 11.5–15.5)
WBC: 4.8 10*3/uL (ref 4.0–10.5)

## 2017-02-24 LAB — RENAL FUNCTION PANEL
ALBUMIN: 3.1 g/dL — AB (ref 3.5–5.0)
ANION GAP: 14 (ref 5–15)
BUN: 31 mg/dL — ABNORMAL HIGH (ref 6–20)
CHLORIDE: 94 mmol/L — AB (ref 101–111)
CO2: 26 mmol/L (ref 22–32)
Calcium: 8.4 mg/dL — ABNORMAL LOW (ref 8.9–10.3)
Creatinine, Ser: 9.75 mg/dL — ABNORMAL HIGH (ref 0.61–1.24)
GFR calc Af Amer: 6 mL/min — ABNORMAL LOW (ref >60)
GFR, EST NON AFRICAN AMERICAN: 5 mL/min — AB (ref >60)
Glucose, Bld: 94 mg/dL (ref 65–99)
PHOSPHORUS: 4.9 mg/dL — AB (ref 2.5–4.6)
POTASSIUM: 4.8 mmol/L (ref 3.5–5.1)
Sodium: 134 mmol/L — ABNORMAL LOW (ref 135–145)

## 2017-02-24 LAB — HEPATITIS C VRS RNA DETECT BY PCR-QUAL: Hepatitis C Vrs RNA by PCR-Qual: POSITIVE — AB

## 2017-02-24 MED ORDER — LIDOCAINE HCL (PF) 1 % IJ SOLN: 5.0000 mL | INTRAMUSCULAR | Status: DC | PRN

## 2017-02-24 MED ORDER — LIDOCAINE-PRILOCAINE 2.5-2.5 % EX CREA: 1.0000 "application " | TOPICAL_CREAM | CUTANEOUS | Status: DC | PRN

## 2017-02-24 MED ORDER — ISOSORB DINITRATE-HYDRALAZINE 20-37.5 MG PO TABS: 1.0000 | ORAL_TABLET | Freq: Three times a day (TID) | ORAL | 1 refills | Status: AC

## 2017-02-24 MED ORDER — ALTEPLASE 2 MG IJ SOLR: 2.0000 mg | Freq: Once | INTRAMUSCULAR | Status: DC | PRN

## 2017-02-24 MED ORDER — PENTAFLUOROPROP-TETRAFLUOROETH EX AERO: 1.0000 "application " | INHALATION_SPRAY | CUTANEOUS | Status: DC | PRN

## 2017-02-24 MED ORDER — SODIUM CHLORIDE 0.9 % IV SOLN: 100.0000 mL | INTRAVENOUS | Status: DC | PRN

## 2017-02-24 MED ORDER — HEPARIN SODIUM (PORCINE) 1000 UNIT/ML DIALYSIS: 1000.0000 [IU] | INTRAMUSCULAR | Status: DC | PRN

## 2017-02-24 MED FILL — Midazolam HCl Inj 2 MG/2ML (Base Equivalent): INTRAMUSCULAR | Qty: 2 | Status: AC

## 2017-02-24 NOTE — Procedures (Signed)
  I was present at this dialysis session, have reviewed the session itself and made  appropriate changes Vinson Moselle MD [facility] pager [phone]   02/24/2017, 12:52 PM

## 2017-03-10 NOTE — Telephone Encounter (Signed)
He has an appointment

## 2017-03-10 NOTE — Telephone Encounter (Signed)
Jeffery Riley, He is positive for hep c, can be added to a clinic per Dr Luciana Axe. Thank you!  Marcelino Duster

## 2017-03-18 LAB — DRUG SCREEN 10 W/CONF, SERUM
Amphetamines, IA: NEGATIVE ng/mL
BARBITURATES, IA: NEGATIVE ug/mL
BENZODIAZEPINES, IA: NEGATIVE ng/mL
COCAINE & METABOLITE, IA: POSITIVE ng/mL
Methadone, IA: NEGATIVE ng/mL
Opiates, IA: NEGATIVE ng/mL
Oxycodones, IA: NEGATIVE ng/mL
PHENCYCLIDINE, IA: NEGATIVE ng/mL
PROPOXYPHENE, IA: NEGATIVE ng/mL
THC(MARIJUANA) METABOLITE, IA: NEGATIVE ng/mL

## 2017-03-18 LAB — COCAINE,MS,WB/SP RFX
Benzoylecgonine: 384 ng/mL
COCAINE CONFIRMATION: POSITIVE
COCAINE: NEGATIVE ng/mL

## 2017-03-21 ENCOUNTER — Emergency Department: Admit: 2017-03-21 | Payer: MEDICAID | Primary: Family Medicine

## 2017-03-21 ENCOUNTER — Inpatient Hospital Stay
Admit: 2017-03-21 | Discharge: 2017-03-21 | Disposition: A | Discharge status: Home / Self Care | Payer: MEDICAID | Attending: Emergency Medicine

## 2017-03-21 DIAGNOSIS — I120 Hypertensive chronic kidney disease with stage 5 chronic kidney disease or end stage renal disease: Secondary | ICD-10-CM

## 2017-03-21 LAB — CBC WITH AUTOMATED DIFF
ABS. BASOPHILS: 0 10*3/uL (ref 0.0–0.1)
ABS. EOSINOPHILS: 0.2 10*3/uL (ref 0.0–0.4)
ABS. LYMPHOCYTES: 1.6 10*3/uL (ref 0.9–3.6)
ABS. MONOCYTES: 0.4 10*3/uL (ref 0.05–1.2)
ABS. NEUTROPHILS: 2.5 10*3/uL (ref 1.8–8.0)
BASOPHILS: 0 % (ref 0–2)
EOSINOPHILS: 5 % (ref 0–5)
HCT: 29.1 % — ABNORMAL LOW (ref 36.0–48.0)
HGB: 9.8 g/dL — ABNORMAL LOW (ref 13.0–16.0)
LYMPHOCYTES: 34 % (ref 21–52)
MCH: 31.3 PG (ref 24.0–34.0)
MCHC: 33.7 g/dL (ref 31.0–37.0)
MCV: 93 FL (ref 74.0–97.0)
MONOCYTES: 8 % (ref 3–10)
MPV: 9.7 FL (ref 9.2–11.8)
NEUTROPHILS: 53 % (ref 40–73)
PLATELET: 80 10*3/uL — ABNORMAL LOW (ref 135–420)
RBC: 3.13 M/uL — ABNORMAL LOW (ref 4.70–5.50)
RDW: 14 % (ref 11.6–14.5)
WBC: 4.8 10*3/uL (ref 4.6–13.2)

## 2017-03-21 LAB — METABOLIC PANEL, BASIC
Anion gap: 15 mmol/L (ref 3.0–18)
BUN/Creatinine ratio: 4 — ABNORMAL LOW (ref 12–20)
BUN: 68 MG/DL — ABNORMAL HIGH (ref 7.0–18)
CO2: 28 mmol/L (ref 21–32)
Calcium: 8.9 MG/DL (ref 8.5–10.1)
Chloride: 96 mmol/L — ABNORMAL LOW (ref 100–108)
Creatinine: 18.5 MG/DL — ABNORMAL HIGH (ref 0.6–1.3)
GFR est AA: 3 mL/min/{1.73_m2} — ABNORMAL LOW (ref >60)
GFR est non-AA: 3 mL/min/{1.73_m2} — ABNORMAL LOW (ref >60)
Glucose: 113 mg/dL — ABNORMAL HIGH (ref 74–99)
Potassium: 4 mmol/L (ref 3.5–5.5)
Sodium: 139 mmol/L (ref 136–145)

## 2017-03-21 LAB — EKG, 12 LEAD, INITIAL
Atrial Rate: 75 {beats}/min
Calculated P Axis: 80 degrees
Calculated R Axis: 44 degrees
Calculated T Axis: 41 degrees
Diagnosis: NORMAL
P-R Interval: 158 ms
Q-T Interval: 452 ms
QRS Duration: 82 ms
QTC Calculation (Bezet): 504 ms
Ventricular Rate: 75 {beats}/min

## 2017-03-21 LAB — POC CHEM8
Anion gap, POC: 19 (ref 10–20)
BUN, POC: 65 MG/DL — ABNORMAL HIGH (ref 7–18)
CO2, POC: 27 MMOL/L — ABNORMAL HIGH (ref 19–24)
Calcium, ionized (POC): 1.07 mmol/L — ABNORMAL LOW (ref 1.12–1.32)
Chloride, POC: 99 MMOL/L — ABNORMAL LOW (ref 100–108)
Creatinine, POC: 17.9 MG/DL — ABNORMAL HIGH (ref 0.6–1.3)
GFRAA, POC: 3 mL/min/{1.73_m2} — ABNORMAL LOW (ref >60)
GFRNA, POC: 3 mL/min/{1.73_m2} — ABNORMAL LOW (ref >60)
Glucose, POC: 111 MG/DL — ABNORMAL HIGH (ref 74–106)
Hematocrit, POC: 29 % — ABNORMAL LOW (ref 36–49)
Hemoglobin, POC: 9.9 G/DL — ABNORMAL LOW (ref 12–16)
Potassium, POC: 4 MMOL/L (ref 3.5–5.5)
Sodium, POC: 140 MMOL/L (ref 136–145)

## 2017-03-21 LAB — EKG 12-LEAD
Atrial Rate: 75 {beats}/min
Diagnosis: NORMAL
P Axis: 80 degrees
P-R Interval: 158 ms
Q-T Interval: 452 ms
QRS Duration: 82 ms
QTc Calculation (Bazett): 504 ms
R Axis: 44 degrees
T Axis: 41 degrees
Ventricular Rate: 75 {beats}/min

## 2017-03-21 MED ORDER — ONDANSETRON (PF) 4 MG/2 ML INJECTION
4 mg/2 mL | Freq: Four times a day (QID) | INTRAMUSCULAR | Status: DC | PRN
Start: 2017-03-21 — End: 2017-03-23

## 2017-03-21 MED ORDER — CARVEDILOL 25 MG TAB
25 mg | Freq: Two times a day (BID) | ORAL | Status: DC
Start: 2017-03-21 — End: 2017-03-23
  Administered 2017-03-22 – 2017-03-23 (×3): via ORAL

## 2017-03-21 MED ORDER — HYDRALAZINE 50 MG TAB
50 mg | Freq: Three times a day (TID) | ORAL | Status: DC
Start: 2017-03-21 — End: 2017-03-23
  Administered 2017-03-22 – 2017-03-23 (×4): via ORAL

## 2017-03-21 MED ORDER — EPOETIN ALFA 2,000 UNIT/ML IJ SOLN
2000 unit/mL | INTRAMUSCULAR | Status: DC
Start: 2017-03-21 — End: 2017-03-23
  Administered 2017-03-22: 17:00:00 via INTRAVENOUS

## 2017-03-21 MED ORDER — METOPROLOL TARTRATE 50 MG TAB
50 mg | Freq: Two times a day (BID) | ORAL | Status: DC
Start: 2017-03-21 — End: 2017-03-21
  Administered 2017-03-21: 22:00:00 via ORAL

## 2017-03-21 MED ORDER — AMLODIPINE 10 MG TAB
10 mg | Freq: Every day | ORAL | Status: DC
Start: 2017-03-21 — End: 2017-03-23
  Administered 2017-03-23: 12:00:00 via ORAL

## 2017-03-21 MED ORDER — ATORVASTATIN 40 MG TAB
40 mg | Freq: Every evening | ORAL | Status: DC
Start: 2017-03-21 — End: 2017-03-23
  Administered 2017-03-23: 02:00:00 via ORAL

## 2017-03-21 MED ORDER — CALCIUM ACETATE 667 MG CAP
667 mg | Freq: Three times a day (TID) | ORAL | Status: DC
Start: 2017-03-21 — End: 2017-03-23
  Administered 2017-03-21 – 2017-03-23 (×4): via ORAL

## 2017-03-21 MED ORDER — ISOSORBIDE MONONITRATE SR 30 MG 24 HR TAB
30 mg | Freq: Every day | ORAL | Status: DC
Start: 2017-03-21 — End: 2017-03-23
  Administered 2017-03-23: 12:00:00 via ORAL

## 2017-03-21 MED ORDER — DOCUSATE SODIUM 100 MG CAP
100 mg | Freq: Two times a day (BID) | ORAL | Status: DC | PRN
Start: 2017-03-21 — End: 2017-03-23

## 2017-03-21 MED ORDER — B COMPLEX-VITAMIN C-FOLIC ACID 1 MG CAP
1 mg | Freq: Every day | ORAL | Status: DC
Start: 2017-03-21 — End: 2017-03-23
  Administered 2017-03-22 – 2017-03-23 (×2): via ORAL

## 2017-03-21 MED ORDER — LOSARTAN 50 MG TAB
50 mg | Freq: Every day | ORAL | Status: DC
Start: 2017-03-21 — End: 2017-03-23
  Administered 2017-03-21 – 2017-03-23 (×2): via ORAL

## 2017-03-21 MED FILL — CALCIUM ACETATE 667 MG CAP: 667 mg | ORAL | Qty: 2

## 2017-03-21 MED FILL — LOSARTAN 25 MG TAB: 25 mg | ORAL | Qty: 2

## 2017-03-21 MED FILL — METOPROLOL TARTRATE 50 MG TAB: 50 mg | ORAL | Qty: 1

## 2017-03-21 NOTE — ED Notes (Signed)
Provider is aware of patient hypertension and medications have been administered. Pt can proceed to medical floor.

## 2017-03-21 NOTE — Consults (Signed)
[place]  MR#: [id number]  DOB: 30-Dec-1965  ACCOUNT #: [id number]   DATE OF SERVICE: 03/21/2017    RENAL CONSULTATION     REQUESTING PHYSICIAN:  Emergency room physician, Dr. Rosiland Oz.     REASON FOR CONSULTATION:  Dialysis patient, moved from different city, needs help to find a dialysis center and needs also dialysis.    HISTORY OF PRESENT ILLNESS:  This 51 year old African American male, whom I  am seeing  first time at the request of the emergency room physician.  He states that he has a history of ESRD for a long period of time.  He was diagnosed with a history of hypertension at age 23, and he is on dialysis since age 57.  So, in fact, he has been getting dialysis for the last 40 years as per him.  He used to get dialysis somewhere in [facility] in [place], and last dialysis was on Wednesday.  Since then, he missed dialysis.  He has been planning to transfer to [place] for a long period of time as his life situation has been changed, but before it could happen, he had to come to [place] and arrived last night with the plan to stay in the  [place] for at least the next 10 years or so or more.  He lives with his girlfriend here.     He claims that since starting dialysis almost 40 years back, he had 2 kidney transplants.  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  As per him, the last transplant was in 2013 and lasted about 2-1/2 years.  Before that, the first one was in early 2000 and lasted for 7 years or so.  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  He is not on any more antirejection medication.  He is not sure about his CMV status.  Also, he claims that he is not diabetic.  No history of any stroke, but had a mild heart attack, including recent  cardiac catheterization, which did show some minor coronary artery disease.  Also, he has a history of hyperlipidemia.  Also, he claims that he has history of hepatitis C, which he got from the blood transfusion.  No history of any HIV or any IV drug abuse or any other major health issue.  He does not have any lung disease or COPD.  No reflux or any GI-related problem.    PAST MEDICAL HISTORY:  As I mentioned.    SOCIAL HISTORY:  Claims nonsmoker, nondrinker, no drugs.  Not married.  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    ALLERGIES:  ALLERGIC TO PENICILLIN, LEVAQUIN, AND VICODIN.    LIST OF MEDICATIONS:  As follows:  Hydralazine, amlodipine, Coreg, calcium acetate, Sensipar.  Sensipar he is not taking anymore as his PTH is low, as per him.      Currently he is not on the transplant list and no more on transplant evaluation.    REVIEW OF SYSTEMS:  GENERAL:  Well-built, well-nourished gentleman without any acute distress.  CARDIOVASCULAR:  No chest pain, no palpitation, no shortness of breath.  RESPIRATORY:  No cough, no wheezing, no shortness  of breath, no hemoptysis.  GASTROINTESTINAL:  No nausea, no vomiting.  GENITOURINARY:  He does not make any urine for a long period of time.  No flank pain.  No other abdominal discomfort.  NEUROLOGIC:  No headache, no seizure, no tremor.    PHYSICAL EXAMINATION:  VITAL SIGNS:  Temperature 97.9, heart rate runs between 76-85 per minute, blood pressure 198/91, 198/89, 197/90 and the last blood pressure around 2:30 is 175/83.  Respiration of 14, oxygen saturation of 97%.  His weight today is 63.5 kilograms.  His dry weight is not clear to me.  HEENT:  Oral mucosa moist.  NECK:  Jugular venous pressure not distended.  Carotid:  No audible bruit.  No palpable lymph nodes in the neck.  Thyroid is central.  LUNGS:  Good air entry into both sides.  Vesicular breath sound.   HEART:  S1, S2, without any S3 or S4.  ABDOMEN:  Soft.  No palpable mass.  No audible bruit in the periumbilical area.  He has evidence of 2 transplants in the past.  Left side looks like the kidney was taken out; right side, I could not feel the kidney     EXTREMITIES:  Ankle negative edema.  No calf muscle tenderness.      DATA:  Chest x-ray reviewed.  No signs of any volume overload.  Normal sized heart.  Costophrenic angles are clear.      His relevant labs done today:  WBC of 4.8 with hemoglobin of 9.8, hematocrit of 29.1, platelets of 80,000.  I am not sure about  baseline platelets.  Chemistries:  Sodium 139, potassium 4.0, chloride 96, CO2 of 28, glucose of 138, blood urea 68, creatinine of 18.50, calcium 8.9.  EGFR around 2 mL per minute.  No other lab being done.      EKG:  Normal sinus rhythm, possible left atrial enlargement, left ventricular hypertrophy, nonspecific ST-T-wave changes.  impression:  EKG I reviewed, normal sinus rhythm and there are some tented T waves, but looks like it is related to left ventricular hypertrophy, no evidence of any hyperkalemic changes other than in V5 and V6 and tented T waves.    IMPRESSION AND PLAN   1.  End-stage renal disease.   2.  Uncontrolled high blood pressure.  3.  Thrombocytopenia.  4.  History of hepatitis C.  5.  Anemia.  6.  Possible history of secondary hyperparathyroidism.  7.  Hyperphosphatemia.  8.  No dialysis center set up for him in this  [place].      Plan:  There is no immediate need for any dialysis today.  He will be admitted.  Will start his renal diet, medications.  Control the blood pressure.  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  Will use tomorrow that AV graft and will dialyze him tomorrow and will try to find a dialysis center for him.  Cause of this thrombocytopenia is not clear.  Will also check PT, PTT, HIT panel,and also check his hepatitis B and hepatitis C status.  Further  recommendations will be given based on the hospital course.      Doyce Loose, MD       [facility] / [facility]  D: 03/21/2017 16:40     T: 03/21/2017 19:18  JOB #: [id number]

## 2017-03-21 NOTE — ED Provider Notes (Signed)
EMERGENCY DEPARTMENT HISTORY AND PHYSICAL EXAM    10:00 AM      Date: 03/21/2017  Patient Name: William Ruiz    History of Presenting Illness     Chief Complaint   Patient presents with   ??? Other     needs dialysis         History Provided By: Patient    Chief Complaint: SOB  Duration:  1 days  Timing:  Acute  Location: [name]  Quality: [name]  Severity: slight  Modifying Factors: None  Associated Symptoms: pt denies all other sx      Additional History (Context): William Ruiz is a 51 y.o. male with renal failure, dialysis, and HTN who presents with acute SOB that occurred yesterday. Pt reports that his living situation has changed and he has relocated to the [place] area. He has been a dialysis pt for the past 40 years and missed his Friday dialysis treatment 2 days ago. He denies dizziness, CP, and insists slight SOB is his only sx. Pt admitted he had an MI 3 weeks ago, he insists he is recovering quickly. Pt runs regularly and tries to remain healthy and active. No other concerns, modifying factors, or symptoms were expressed by the pt at this time.      PCP: Phys Other, MD        Past History     Past Medical History:  No past medical history on file.    Past Surgical History:  No past surgical history on file.    Family History:  No family history on file.    Social History:  Social History   Substance Use Topics   ??? Smoking status: Not on file   ??? Smokeless tobacco: Not on file   ??? Alcohol use Not on file       Allergies:  Allergies   Allergen Reactions   ??? Penicillins Anaphylaxis   ??? Levofloxacin Other (comments)   ??? Vicodin [Hydrocodone-Acetaminophen] Other (comments)         Review of Systems     Review of Systems   Constitutional: Negative for chills, fatigue and fever.   HENT: Negative for facial swelling.    Eyes: Negative for redness and visual disturbance.   Respiratory: Positive for shortness of breath (slight).    Cardiovascular: Negative for chest pain.    Gastrointestinal: Negative for abdominal pain, diarrhea and nausea.   Genitourinary: Negative for dysuria.   Musculoskeletal: Negative for back pain.   Skin: Negative for pallor.   Neurological: Negative for dizziness, weakness and light-headedness.   Psychiatric/Behavioral: Negative for confusion.   All other systems reviewed and are negative.        Physical Exam     Visit Vitals   ??? BP 175/83   ??? Pulse 76   ??? Temp 97.9 ??F (36.6 ??C)   ??? Resp 14   ??? Ht  (1.702 m)   ??? Wt 63.5 kg (139 lb 15.9 oz)   ??? SpO2 97%   ??? BMI 21.93 kg/m2     Physical Exam   Constitutional: He appears well-developed and well-nourished. No distress.   HENT:   Head: Normocephalic and atraumatic.   Neck: Normal range of motion. Neck supple.   Cardiovascular: Normal rate, regular rhythm and normal heart sounds.  Exam reveals no gallop and no friction rub.    No murmur heard.  RUE graft with palpable thrill.   Pulmonary/Chest: Breath sounds normal. No respiratory distress. He has no  wheezes. He has no rales.   Neurological: He is alert.   Skin: Skin is warm. No rash noted. He is not diaphoretic.   Nursing note and vitals reviewed.        Diagnostic Study Results     Labs -  Recent Results (from the past 12 hour(s))   CBC WITH AUTOMATED DIFF    Collection Time: 03/21/17 11:15 AM   Result Value Ref Range    WBC 4.8 4.6 - 13.2 K/uL    RBC 3.13 (L) 4.70 - 5.50 M/uL    HGB 9.8 (L) 13.0 - 16.0 g/dL    HCT 21.3 (L) 08.6 - 48.0 %    MCV 93.0 74.0 - 97.0 FL    MCH 31.3 24.0 - 34.0 PG    MCHC 33.7 31.0 - 37.0 g/dL    RDW 57.8 46.9 - 62.9 %    PLATELET 80 (L) 135 - 420 K/uL    MPV 9.7 9.2 - 11.8 FL    NEUTROPHILS 53 40 - 73 %    LYMPHOCYTES 34 21 - 52 %    MONOCYTES 8 3 - 10 %    EOSINOPHILS 5 0 - 5 %    BASOPHILS 0 0 - 2 %    ABS. NEUTROPHILS 2.5 1.8 - 8.0 K/UL    ABS. LYMPHOCYTES 1.6 0.9 - 3.6 K/UL    ABS. MONOCYTES 0.4 0.05 - 1.2 K/UL    ABS. EOSINOPHILS 0.2 0.0 - 0.4 K/UL    ABS. BASOPHILS 0.0 0.0 - 0.1 K/UL    DF AUTOMATED      METABOLIC PANEL, BASIC    Collection Time: 03/21/17 11:15 AM   Result Value Ref Range    Sodium 139 136 - 145 mmol/L    Potassium 4.0 3.5 - 5.5 mmol/L    Chloride 96 (L) 100 - 108 mmol/L    CO2 28 21 - 32 mmol/L    Anion gap 15 3.0 - 18 mmol/L    Glucose 113 (H) 74 - 99 mg/dL    BUN 68 (H) 7.0 - 18 MG/DL    Creatinine 52.84 (H) 0.6 - 1.3 MG/DL    BUN/Creatinine ratio 4 (L) 12 - 20      GFR est AA 3 (L) >60 ml/min/1.31m2    GFR est non-AA 3 (L) >60 ml/min/1.23m2    Calcium 8.9 8.5 - 10.1 MG/DL   POC CHEM8    Collection Time: 03/21/17 11:17 AM   Result Value Ref Range    CO2, POC 27 (H) 19 - 24 MMOL/L    Glucose, POC 111 (H) 74 - 106 MG/DL    BUN, POC 65 (H) 7 - 18 MG/DL    Creatinine, POC 13.2 (H) 0.6 - 1.3 MG/DL    GFRAA, POC 3 (L) >44 ml/min/1.54m2    GFRNA, POC 3 (L) >60 ml/min/1.61m2    Sodium, POC 140 136 - 145 MMOL/L    Potassium, POC 4.0 3.5 - 5.5 MMOL/L    Calcium, ionized (POC) 1.07 (L) 1.12 - 1.32 mmol/L    Chloride, POC 99 (L) 100 - 108 MMOL/L    Anion gap, POC 19 10 - 20      Hematocrit, POC 29 (L) 36 - 49 %    Hemoglobin, POC 9.9 (L) 12 - 16 G/DL   EKG, 12 LEAD, INITIAL    Collection Time: 03/21/17 11:43 AM   Result Value Ref Range    Ventricular Rate 75 BPM    Atrial Rate 75 BPM  P-R Interval 158 ms    QRS Duration 82 ms    Q-T Interval 452 ms    QTC Calculation (Bezet) 504 ms    Calculated P Axis 80 degrees    Calculated R Axis 44 degrees    Calculated T Axis 41 degrees    Diagnosis       Normal sinus rhythm  Possible Left atrial enlargement  Left ventricular hypertrophy  Nonspecific ST abnormality  Prolonged QT  Abnormal ECG  No previous ECGs available         Radiologic Studies -   XR CHEST PA LAT   Final Result        No acute process    Medical Decision Making   I am the first provider for this patient.    I reviewed the vital signs, available nursing notes, past medical history, past surgical history, family history and social history.    Vital Signs-Reviewed the patient's vital signs.     Pulse Oximetry Analysis -  Nonhypoxic    Cardiac Monitor:  Rate: 85  Rhythm:  NSR    EKG: 1143, rate 75, normal axis, NSR, normal intervals no ST changes    Records Reviewed: Nursing Notes and Old Medical Records (Time of Review: 10:00 AM)    ED Course: Progress Notes, Reevaluation, and Consults:  12:33 PM Consult: I discussed care with Dr. Ardelle Park, Nephrology.  It was a standard discussion including patient history, chief complaint, available diagnostic results, and predicted treatment course. He agreed to consult with the pt for dialysis today.      4:17 PM  Discused with Dr. Gerhard Munch, hospitalist, accepts pt for admission.     Provider Notes (Medical Decision Making): 51 y/o male presents with need for dialysis. check basic labs, cxr.     For Hospitalized Patients:    1. Hospitalization Decision Time:  The decision to hospitalize the patient was made by Dr. Noreene Filbert at 1233 on 03/21/2017    2. Aspirin: Aspirin was not given because the patient did not present with a stroke at the time of their Emergency Department evaluation    Diagnosis     Clinical Impression:   1. Encounter for dialysis [facility])        Disposition: admitted      Attestations:  Scribe Attestation     Margo Common acting as a scribe for and in the presence of Laurin Coder, DO      March 21, 2017 at 10:00 AM       Provider Attestation:      I personally performed the services described in the documentation, reviewed the documentation, as recorded by the scribe in my presence, and it accurately and completely records my words and actions. March 21, 2017 at 10:00 AM - Laurin Coder, DO    _______________________________

## 2017-03-21 NOTE — ED Notes (Signed)
Pt laying with eyes closed and appears to be sleeping. Easily rouses to verbal/tactile stimulus. NAD noted. Will continue to monitor.

## 2017-03-21 NOTE — ED Notes (Signed)
Dr Noreene Filbert attempted EJ; able to gain specimens, but IV infiltrated. POC chem 8 done and additional blood sent to lab.

## 2017-03-21 NOTE — ED Triage Notes (Signed)
Patient recently moved from [place] and has missed his dialysis. Patient has not had dialysis since Wednesday.

## 2017-03-21 NOTE — H&P (Signed)
History & Physical    Patient: William Ruiz MRN: [id number]  CSN: [id number]    Date of Birth: 1966-08-28  Age: 51 y.o.  Sex: male      DOA: 03/21/2017    Chief Complaint:   Chief Complaint   Patient presents with   ??? Other     needs dialysis          HPI:     William Ruiz is a 51 y.o. African American male who has PMH of ESRD on HD for many years s/p ypertensive crisis episode whe n he was 51 y/o., Anginial pain s/p -ve cardiac cath( not clear if he had angioplasty or not but no stents placed), hard to control HTN on multiple meds.  Pt recently moved from [facility] and did not establish HD center and is due for HD.  Pt will be admitted for HD and establishing dialysis ceneter as an OP after DC.  Pt like to jog for many miles daily    Past Medical History:   Diagnosis Date   ??? Benign essential HTN    ??? ESRD (end stage renal disease) on dialysis [facility])    ??? HLD (hyperlipidemia)        Past Surgical History:   Procedure Laterality Date   ??? HX ARTERIOVENOUS FISTULA         No family history on file.    Social History     Social History   ??? Marital status: SINGLE     Spouse name: [name]   ??? Number of children: [name]   ??? Years of education: [name]     Social History Main Topics   ??? Smoking status: None   ??? Smokeless tobacco: None   ??? Alcohol use None   ??? Drug use: None   ??? Sexual activity: Not Asked     Other Topics Concern   ??? None     Social History Narrative   ??? None       Prior to Admission medications    Medication Sig Start Date End Date Taking? Authorizing Provider   carvedilol (COREG) 25 mg tablet Take 25 mg by mouth two (2) times a day.   Yes Phys Other, MD   b complex-vitamin c-folic acid 0.8 mg (DIALYVITE 800) 0.8 mg tab tablet Take 1 Tab by mouth daily.   Yes Phys Other, MD   hydrALAZINE (APRESOLINE) 100 mg tablet Take 100 mg by mouth three (3) times daily.   Yes Phys Other, MD   calcium acetate (PHOSLO) 667 mg tab tablet Take 1,334 mg by mouth three (3) times daily (with meals).   Yes Phys Other, MD    atorvastatin (LIPITOR) 80 mg tablet Take 80 mg by mouth daily.   Yes Phys Other, MD   cinacalcet (SENSIPAR) 60 mg tab Take 60 mg by mouth.   Yes Phys Other, MD   amLODIPine (NORVASC) 10 mg tablet Take 10 mg by mouth daily.   Yes Phys Other, MD   b complex-vitamin c-folic acid (NEPHROCAPS) 1 mg capsule Take 1 Cap by mouth daily.   Yes Phys Other, MD   aspirin delayed-release 81 mg tablet Take 81 mg by mouth every other day.   Yes Phys Other, MD   ipratropium-albuterol (COMBIVENT RESPIMAT) 20-100 mcg/actuation inhaler Take 1 Puff by inhalation every six (6) hours as needed for Wheezing.   Yes Phys Other, MD       Allergies   Allergen Reactions   ??? Penicillins Anaphylaxis   ???  Levofloxacin Other (comments)   ??? Vicodin [Hydrocodone-Acetaminophen] Other (comments)         Review of Systems  GENERAL: Patient alert, awake and oriented times 3, able to communicate full sentences and not in distress. healthy  HEENT: No change in vision, no earache, tinnitus, sore throat or sinus congestion.   NECK: No pain or stiffness.   PULMONARY: No shortness of breath, cough or wheeze.   Cardiovascular: no pnd / orthopnea, no CP  GASTROINTESTINAL: No abdominal pain, nausea, vomiting or diarrhea, melena or bright red blood per rectum.   GENITOURINARY: No urinary frequency, urgency, hesitancy or dysuria.   MUSCULOSKELETAL: No joint or muscle pain, no back pain, no recent trauma.   DERMATOLOGIC: No rash, no itching, no lesions.   ENDOCRINE: No polyuria, polydipsia, no heat or cold intolerance. No recent change in weight.   HEMATOLOGICAL: No anemia or easy bruising or bleeding.   NEUROLOGIC: No headache, seizures, numbness, tingling or weakness.       Physical Exam:     Physical Exam:  Visit Vitals   ??? BP 146/67 (BP 1 Location: Left arm, BP Patient Position: At rest)   ??? Pulse 62   ??? Temp 98.3 ??F (36.8 ??C)   ??? Resp 18   ??? Ht  (1.702 m)   ??? Wt 63.5 kg (139 lb 15.9 oz)   ??? SpO2 95%   ??? BMI 21.93 kg/m2            Temp (24hrs), Avg:98.1 ??F (36.7 ??C), Min:97.9 ??F (36.6 ??C), Max:98.3 ??F (36.8 ??C)             General:  Alert, cooperative, no distress, appears stated age.              Head: Normocephalic, without obvious abnormality, atraumatic.   Eyes:  Conjunctivae/corneas clear. PERRL, EOMs intact.   Nose: Nares normal. No drainage or sinus tenderness.   Neck: Supple, symmetrical, trachea midline, no adenopathy, thyroid: no enlargement, no carotid bruit and no JVD.   Lungs:   Clear to auscultation bilaterally.   Heart:  Regular rate and rhythm, S1, S2 normal.     Abdomen: Soft, non-tender. Bowel sounds normal.    Extremities: Extremities normal, atraumatic, no cyanosis or edema.   Pulses: 2+ and symmetric all extremities.   Skin:  No rashes or lesions   Neurologic: AAOx3, No focal motor or sensory deficit.       Labs Reviewed:    All lab results for the last 24 hours reviewed.  CXR and EKG    Procedures/imaging: see electronic medical records for all procedures/Xrays and details which were not copied into this note but were reviewed prior to creation of Plan      Assessment/Plan     Principal Problem:    Encounter for dialysis [facility]) (03/21/2017)    Active Problems:    Essential hypertension (03/21/2017)      Mixed hyperlipidemia (03/21/2017)       ESRD on HD >> admit to Observation  Nephro consult is done  HD in AM  Hold sensipar as PTH is low  Continue BP meds  Case MGT consult  Anginal pain >> Imdur and norvasc  HTN >> continue home meds.    Plan of care is discussed in details with Patient/Family at bedside and agreed upon    Roe Coombs, MD  03/22/2017 6:30 PM

## 2017-03-21 NOTE — ED Notes (Signed)
After multiple attempts by nurse and ED techs, PIV and blood work was not attained. Provider notified and is at bedside with [place].

## 2017-03-21 NOTE — Progress Notes (Signed)
ESRD patient moved from [place], [place]  to [facility] patient without any Dialysis center, did not get any dialysis for last 3 to 4 days. No signs of any Fluid Overload, no Hyperkalemia, no acidosis, BP is on the high side. No reason to acutely dialze him ,,will dialyze tomorrow AM & have to arrange OP dialysis center.  Seems it will be difficult to find a dialysis center.

## 2017-03-21 NOTE — ED Notes (Signed)
Rounds made. No change in status. Will continue to monitor.

## 2017-03-21 NOTE — ED Notes (Signed)
Rounds made. Pt repositioned for comfort. NAD noted. Denies any needs at this time. Pt informed of status.

## 2017-03-21 NOTE — Consults (Signed)
Truro  MR#: [id number]  DOB: 17-May-1966  ACCOUNT #: [id number]   DATE OF SERVICE: 03/21/2017    RENAL CONSULTATION     REQUESTING PHYSICIAN:  Emergency room physician, Dr. Rosiland Oz.     REASON FOR CONSULTATION:  Dialysis patient, moved from different city, needs help to find a dialysis center and needs also dialysis.    HISTORY OF PRESENT ILLNESS:  This 51 year old African American male, whom I  am seeing  first time at the request of the emergency room physician.  He states that he has a history of ESRD for a long period of time.  He was diagnosed with a history of hypertension at age 46, and he is on dialysis since age 31.  So, in fact, he has been getting dialysis for the last 40 years as per him.  He used to get dialysis somewhere in [facility] in [place], and last dialysis was on Wednesday.  Since then, he missed dialysis.  He has been planning to transfer to [place] for a long period of time as his life situation has been changed, but before it could happen, he had to come to [place] and arrived last night with the plan to stay in the  [place] for at least the next 10 years or so or more.  He lives with his girlfriend here.     He claims that since starting dialysis almost 40 years back, he had 2 kidney transplants.  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  As per him, the last transplant was in 2013 and lasted about 2-1/2 years.  Before that, the first one was in early 2000 and lasted for 7 years or so.  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  He is not on any more antirejection medication.  He is not sure about his CMV status.  Also, he claims that he is not diabetic.  No history of any stroke, but had a mild heart attack, including recent cardiac  catheterization, which did show some minor coronary artery disease.  Also, he has a history of hyperlipidemia.  Also, he claims that he has history of hepatitis C, which he got from the blood transfusion.  No history of any HIV or any IV drug abuse or any other major health issue.  He does not have any lung disease or COPD.  No reflux or any GI-related problem.    PAST MEDICAL HISTORY:  As I mentioned.    SOCIAL HISTORY:  Claims nonsmoker, nondrinker, no drugs.  Not married.  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    ALLERGIES:  ALLERGIC TO PENICILLIN, LEVAQUIN, AND VICODIN.    LIST OF MEDICATIONS:  As follows:  Hydralazine, amlodipine, Coreg, calcium acetate, Sensipar.  Sensipar he is not taking anymore as his PTH is low, as per him.      Currently he is not on the transplant list and no more on transplant evaluation.    REVIEW OF SYSTEMS:  GENERAL:  Well-built, well-nourished gentleman without any acute distress.  CARDIOVASCULAR:  No chest pain, no palpitation, no shortness of breath.  RESPIRATORY:  No cough, no wheezing, no shortness  of breath, no hemoptysis.  GASTROINTESTINAL:  No nausea, no vomiting.  GENITOURINARY:  He does not make any urine for a long period of time.  No flank pain.  No other abdominal discomfort.  NEUROLOGIC:  No headache, no seizure, no tremor.    PHYSICAL EXAMINATION:  VITAL SIGNS:  Temperature 97.9, heart rate runs between 76-85 per minute, blood pressure 198/91, 198/89, 197/90 and the last blood pressure around 2:30 is 175/83.  Respiration of 14, oxygen saturation of 97%.  His weight today is 63.5 kilograms.  His dry weight is not clear to me.  HEENT:  Oral mucosa moist.  NECK:  Jugular venous pressure not distended.  Carotid:  No audible bruit.  No palpable lymph nodes in the neck.  Thyroid is central.  LUNGS:  Good air entry into both sides.  Vesicular breath sound.  HEART:  S1, S2, without any S3  or S4.  ABDOMEN:  Soft.  No palpable mass.  No audible bruit in the periumbilical area.  He has evidence of 2 transplants in the past.  Left side looks like the kidney was taken out; right side, I could not feel the kidney     EXTREMITIES:  Ankle negative edema.  No calf muscle tenderness.      DATA:  Chest x-ray reviewed.  No signs of any volume overload.  Normal sized heart.  Costophrenic angles are clear.      His relevant labs done today:  WBC of 4.8 with hemoglobin of 9.8, hematocrit of 29.1, platelets of 80,000.  I am not sure about  baseline platelets.  Chemistries:  Sodium 139, potassium 4.0, chloride 96, CO2 of 28, glucose of 138, blood urea 68, creatinine of 18.50, calcium 8.9.  EGFR around 2 mL per minute.  No other lab being done.      EKG:  Normal sinus rhythm, possible left atrial enlargement, left ventricular hypertrophy, nonspecific ST-T-wave changes.  impression:  EKG I reviewed, normal sinus rhythm and there are some tented T waves, but looks like it is related to left ventricular hypertrophy, no evidence of any hyperkalemic changes other than in V5 and V6 and tented T waves.    IMPRESSION AND PLAN   1.  End-stage renal disease.   2.  Uncontrolled high blood pressure.  3.  Thrombocytopenia.  4.  History of hepatitis C.  5.  Anemia.  6.  Possible history of secondary hyperparathyroidism.  7.  Hyperphosphatemia.  8.  No dialysis center set up for him in this  [place].      Plan:  There is no immediate need for any dialysis today.  He will be admitted.  Will start his renal diet, medications.  Control the blood pressure.  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  Will use tomorrow that AV graft and will dialyze him tomorrow and will try to find a dialysis center for him.  Cause of this thrombocytopenia is not clear.  Will also check PT, PTT, HIT panel,and also check his hepatitis B and hepatitis C status.  Further recommendations will be given based on the  hospital course.      Doyce Loose, MD       [facility] / PN  D: 03/21/2017 16:40     T: 03/21/2017 19:18  JOB #: [id number]

## 2017-03-22 LAB — METABOLIC PANEL, COMPREHENSIVE
A-G Ratio: 0.8 (ref 0.8–1.7)
ALT (SGPT): 80 U/L — ABNORMAL HIGH (ref 16–61)
AST (SGOT): 32 U/L (ref 15–37)
Albumin: 3.2 g/dL — ABNORMAL LOW (ref 3.4–5.0)
Alk. phosphatase: 64 U/L (ref 45–117)
Anion gap: 17 mmol/L (ref 3.0–18)
BUN/Creatinine ratio: 4 — ABNORMAL LOW (ref 12–20)
BUN: 70 MG/DL — ABNORMAL HIGH (ref 7.0–18)
Bilirubin, total: 0.5 MG/DL (ref 0.2–1.0)
CO2: 25 mmol/L (ref 21–32)
Calcium: 8.8 MG/DL (ref 8.5–10.1)
Chloride: 98 mmol/L — ABNORMAL LOW (ref 100–108)
Creatinine: 19.8 MG/DL — ABNORMAL HIGH (ref 0.6–1.3)
GFR est AA: 3 mL/min/{1.73_m2} — ABNORMAL LOW (ref >60)
GFR est non-AA: 3 mL/min/{1.73_m2} — ABNORMAL LOW (ref >60)
Globulin: 4 g/dL (ref 2.0–4.0)
Glucose: 129 mg/dL — ABNORMAL HIGH (ref 74–99)
Potassium: 4 mmol/L (ref 3.5–5.5)
Protein, total: 7.2 g/dL (ref 6.4–8.2)
Sodium: 140 mmol/L (ref 136–145)

## 2017-03-22 LAB — CBC WITH AUTOMATED DIFF
ABS. BASOPHILS: 0 10*3/uL (ref 0.0–0.1)
ABS. EOSINOPHILS: 0.3 10*3/uL (ref 0.0–0.4)
ABS. LYMPHOCYTES: 1.5 10*3/uL (ref 0.9–3.6)
ABS. MONOCYTES: 0.6 10*3/uL (ref 0.05–1.2)
ABS. NEUTROPHILS: 2 10*3/uL (ref 1.8–8.0)
BASOPHILS: 0 % (ref 0–2)
EOSINOPHILS: 7 % — ABNORMAL HIGH (ref 0–5)
HCT: 29.4 % — ABNORMAL LOW (ref 36.0–48.0)
HGB: 9.9 g/dL — ABNORMAL LOW (ref 13.0–16.0)
LYMPHOCYTES: 35 % (ref 21–52)
MCH: 31.4 PG (ref 24.0–34.0)
MCHC: 33.7 g/dL (ref 31.0–37.0)
MCV: 93.3 FL (ref 74.0–97.0)
MONOCYTES: 14 % — ABNORMAL HIGH (ref 3–10)
MPV: 10.8 FL (ref 9.2–11.8)
NEUTROPHILS: 44 % (ref 40–73)
PLATELET: 100 10*3/uL — ABNORMAL LOW (ref 135–420)
RBC: 3.15 M/uL — ABNORMAL LOW (ref 4.70–5.50)
RDW: 14.2 % (ref 11.6–14.5)
WBC: 4.3 10*3/uL — ABNORMAL LOW (ref 4.6–13.2)

## 2017-03-22 LAB — PTT: aPTT: 37.1 s — ABNORMAL HIGH (ref 23.0–36.4)

## 2017-03-22 LAB — LIPID PANEL
CHOL/HDL Ratio: 2.1 (ref 0–5.0)
Cholesterol, total: 89 MG/DL (ref <200)
HDL Cholesterol: 43 MG/DL (ref 40–60)
LDL, calculated: 31.6 MG/DL (ref 0–100)
Triglyceride: 72 MG/DL (ref <150)
VLDL, calculated: 14.4 MG/DL

## 2017-03-22 LAB — PROTHROMBIN TIME + INR
INR: 1.2 (ref 0.8–1.2)
Prothrombin time: 14.2 s (ref 11.5–15.2)

## 2017-03-22 LAB — HEP B SURFACE AB: Hepatitis B surface Ab: DETECTED m[IU]/mL (ref >10.0)

## 2017-03-22 LAB — HEP B SURFACE AG: Hepatitis B surface Ag: NOT DETECTED Index (ref <1.00)

## 2017-03-22 LAB — MAGNESIUM: Magnesium: 3.1 mg/dL — ABNORMAL HIGH (ref 1.6–2.6)

## 2017-03-22 LAB — PHOSPHORUS: Phosphorus: 6 MG/DL — ABNORMAL HIGH (ref 2.5–4.9)

## 2017-03-22 LAB — HEPATITIS B SURFACE ANTIBODY: Hep B S Ab: DETECTED m[IU]/mL (ref >10.0)

## 2017-03-22 MED ORDER — FLU VACCINE QV 2017-18 (36 MOS+)(PF) 60 MCG (15 MCG X 4)/0.5 ML IM SYRINGE
60 mcg (15 mcg x 4)/0.5 mL | INTRAMUSCULAR | Status: AC
Start: 2017-03-22 — End: 2017-03-23
  Administered 2017-03-23: 16:00:00 via INTRAMUSCULAR

## 2017-03-22 MED FILL — PROCRIT 2,000 UNIT/ML INJECTION SOLUTION: 2000 unit/mL | INTRAMUSCULAR | Qty: 2

## 2017-03-22 MED FILL — CALCIUM ACETATE 667 MG CAP: 667 mg | ORAL | Qty: 2

## 2017-03-22 MED FILL — LOSARTAN 50 MG TAB: 50 mg | ORAL | Qty: 1

## 2017-03-22 MED FILL — AMLODIPINE 10 MG TAB: 10 mg | ORAL | Qty: 1

## 2017-03-22 MED FILL — RENAL CAPS 1 MG CAPSULE: 1 mg | ORAL | Qty: 1

## 2017-03-22 MED FILL — HYDRALAZINE 50 MG TAB: 50 mg | ORAL | Qty: 2

## 2017-03-22 MED FILL — CARVEDILOL 25 MG TAB: 25 mg | ORAL | Qty: 1

## 2017-03-22 MED FILL — ISOSORBIDE MONONITRATE SR 30 MG 24 HR TAB: 30 mg | ORAL | Qty: 1

## 2017-03-22 NOTE — Progress Notes (Signed)
RENAL PROGRESS NOTE: Pt seen on dialysis.  Follow up of ESRD        Subjective: Feels good, no Complain, no SOB.     Patient is on Dialysis.     Objective:    Patient Vitals for the past 12 hrs:   Temp Pulse Resp BP SpO2   03/22/17 1330 - (!) 57 - 180/90 -   03/22/17 1300 - 60 - 167/86 -   03/22/17 1230 - 61 - 166/87 -   03/22/17 1215 - 65 - 169/86 -   03/22/17 1200 - 64 20 170/90 -   03/22/17 1130 - 67 20 (!) 175/91 -   03/22/17 1108 98 ??F (36.7 ??C) 65 - 167/85 -   03/22/17 1021 - 68 - 168/74 -   03/22/17 0811 97.9 ??F (36.6 ??C) 61 20 172/80 95 %   03/22/17 0529 98.4 ??F (36.9 ??C) 63 18 147/72 95 %        Intake/Output Summary (Last 24 hours) at 03/22/17 1344  Last data filed at 03/22/17 0910   Gross per 24 hour   Intake              440 ml   Output                0 ml   Net              440 ml       Physical Assessment:     General Appearance: NAD  Lung: clear to auscultation  Heart: regular rate and rhythm and no murmurs, clicks or gallops  Lower Extremities: edema   Access: Right arm AVF, qb 400    Labs    CBC w/Diff    Recent Labs      03/22/17   0246  03/21/17   1115   WBC  4.3*  4.8   RBC  3.15*  3.13*   HGB  9.9*  9.8*   HCT  29.4*  29.1*   MCV  93.3  93.0   MCH  31.4  31.3   MCHC  33.7  33.7   RDW  14.2  14.0    Recent Labs      03/22/17   0246  03/21/17   1115   MONOS  14*  8   EOS  7*  5   BASOS  0  0   RDW  14.2  14.0        Comprehensive Metabolic Profile    Recent Labs      03/22/17   0246  03/21/17   1115   NA  140  139   K  4.0  4.0   CL  98*  96*   CO2  25  28   BUN  70*  68*   CREA  19.80*  18.50*    Recent Labs      03/22/17   0246  03/21/17   1115   CA  8.9   8.8  8.9   PHOS  6.0*   --    ALB  3.2*   --    TP  7.2   --    SGOT  32   --    TBILI  0.5   --           Basic Metabolic Profile       results  reviwed.          MEDS:Reviwed.  Current Facility-Administered Medications  Medication Dose Route Frequency Provider Last Rate Last Dose    ??? losartan (COZAAR) tablet 50 mg  50 mg Oral DAILY Loreli Dollar, MD   50 mg at 03/21/17 1752   ??? amLODIPine (NORVASC) tablet 10 mg  10 mg Oral DAILY Loreli Dollar, MD       ??? B complex-vitaminC-folic acid (NEPHROCAP) cap  1 Cap Oral DAILY Loreli Dollar, MD   1 Cap at 03/21/17 2132   ??? calcium acetate (PHOSLO) capsule 1,334 mg  2 Cap Oral TID WITH MEALS Loreli Dollar, MD   1,334 mg at 03/21/17 1752   ??? atorvastatin (LIPITOR) tablet 40 mg  40 mg Oral QHS Loreli Dollar, MD       ??? epoetin alfa (EPOGEN;PROCRIT) injection 4,000 Units  4,000 Units IntraVENous DIALYSIS MON, WED & Serafina Royals, MD   4,000 Units at 03/22/17 1302   ??? hydrALAZINE (APRESOLINE) tablet 100 mg  100 mg Oral TID Roe Coombs, MD   100 mg at 03/21/17 2133   ??? carvedilol (COREG) tablet 25 mg  25 mg Oral BID Roe Coombs, MD   25 mg at 03/21/17 2133   ??? ondansetron (ZOFRAN) injection 4 mg  4 mg IntraVENous Q6H PRN Roe Coombs, MD       ??? docusate sodium (COLACE) capsule 100 mg  100 mg Oral BID PRN Roe Coombs, MD       ??? isosorbide mononitrate ER (IMDUR) tablet 30 mg  30 mg Oral DAILY Roe Coombs, MD       ??? influenza vaccine 2017-18 (3 yrs+)(PF) (FLUZONE QUAD/FLUARIX QUAD) injection 0.5 mL  0.5 mL IntraMUSCular PRIOR TO DISCHARGE Roe Coombs, MD           Impression:    ESRD: Tolerating HD well.  Anemia of CKD: on  Epogen.  Hyperparathyroidism Yes  Hypertension.  Plan  Dialysis for volume and solute management  Epogen  4000 units.  Hectorol< no need await PTH result.  Continu current anti hypertensive medicine.  Work up for OP dialysis unit in progress, probable he will go to [facility] , [place] , tel No [phone] q [employer] 3rd shift.Once that is finalized he can be discharged.        Loreli Dollar, MD

## 2017-03-22 NOTE — Progress Notes (Signed)
Problem: Falls - Risk of  Goal: *Absence of Falls  Document Schmid Fall Risk and appropriate interventions in the flowsheet.   Outcome: Progressing Towards Goal  Fall Risk Interventions:            Medication Interventions: Assess postural VS orthostatic hypotension, Teach patient to arise slowly, Patient to call before getting OOB, Evaluate medications/consider consulting pharmacy
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
## 2017-03-22 NOTE — Other (Signed)
ACUTE HEMODIALYSIS FLOW SHEET    HEMODIALYSIS ORDERS: Physician: M. Jannette Fogo, MD     Dialyzer: revaclear   Duration: 3.5 hr  BFR: 400   DFR: 800   Dialysate:  Temp 36.7 K+   2    Ca+  2.5 Na 140 Bicarb 30   Weight:  63.5 kg    Bed Scale [x]     Unable to Obtain []      Dry weight/UF Goal:203m Access Right UE AVG  Needle Gauge 15    Heparin []  Bolus      Units    [] Hourly       Units    [x]None      Catheter locking solution Heparin   Pre BP:  167/85    Pulse:     65     Temperature:   98.0  Respirations: 18  Tx: NS   250    ml/Bolus  Other        [] [name]   Labs: Pre        Post:        [] [name]   Additional Orders(medications, blood products, hypotension management):   Epogen 4000 Units    [] [name]     [x] [facility] Consent Verified     CATHETER ACCESS: [x][name]   []Right   []Left   []IJ     []Fem   [] First use X-ray verified     []Tunnel                [] Non Tunneled   []No S/S infection  []Redness  []Drainage []Cultured []Swelling []Pain   []Medical Aseptic Prep Utilized   []Dressing Changed  [] Biopatch  Date:     []Clotted   []Patent   Flows: []Good  []Poor  []Reversed   If access problem, Dr. notified: []Yes    [][name]  Date:           GRAFT/FISTULA ACCESS:  [][name]     [x]Right     []Left     [x]UE     []LE   [x]AVG   []AVF        []Buttonhole    [x]Medical Aseptic Prep Utilized   [x]No S/S infection  []Redness  []Drainage []Cultured []Swelling []Pain    Bruit:   [x] Strong    [] Weak       Thrill :   [x] Strong    [] Weak       Needle Gauge: 15 Length: 1 inch   If access problem, Dr. notified: []Yes     [x][name]  Date:        Please describe access if present and not used:       GENERAL ASSESSMENT:    LUNGS:  Rate  SaO2%        [] [name]    [x] Clear  [] Coarse  [] Crackles  [] Wheezing        [] Diminished     Location : []RLL   []LLL    []RUL  []LUL   Cough: []Productive  []Dry  [x][name]   Respirations:  []Easy  []Labored   Therapy:  [x]RA  []NC  l/min    Mask: []NRB []Venti       O2%                   []Ventilator  []Intubated  [] Trach  [] BiPaP   CARDIAC: [x]Regular      []  Irregular   [] Pericardial Rub  [] JVD        []  Monitored  [] Bedside  [] Remotely monitored [] [name]  Rhythm:    EDEMA: [x] None  []Generalized  [] Pitting [] 1    [] 2    [] 3    [] 4                 [] Facial  [] Pedal  []  UE  [] LE   SKIN:   [x] Warm  [] Hot     [] Cold   [] Dry     [] Pale   [] Diaphoretic                  [] Flushed  [] Jaundiced  [] Cyanotic  [] Rash  [] Weeping   LOC:    [x] Alert      [x]Oriented:    [x] Person     [x] Place  [x]Time               [] Confused  [] Lethargic  [] Medicated  [] Non-responsive     GI / ABDOMEN:  [x] Flat    [] Distended    [] Soft    [] Firm   []  Obese                             [] Diarrhea  [] Bowel Sounds  [] Nausea  [] Vomiting      GU / URINE ASSESSMENT:[] Voiding   [] Oliguria  [x] Anuria   []  Foley     [] Incontinent    []  Incontinent Brief      []  Bathroom Privileges     PAIN: [] 0 [x]1  []2   []3   []4   []5   []6   []7   []8   []9   []10            Scale 0-10  Action/Follow Up:    MOBILITY:  [] Amb    [] Amb/Assist    [x] Bed    [] Wheelchair  [] Stretcher      All Vitals and Treatment Details on Attached [facility]: [facility]          Room # 469     [] 1st Time Acute  [] Stat  [] Routine  [] Urgent     [] Acute Room  []  Bedside  [] ICU/CCU  [] ER   Isolation Precautions:  [] Dialysis   [] Airborne   [] Contact    [] Reverse   Special Considerations:         [] Blood Consent Verified [][name]     ALLERGIES:   Penicillins Anaphylaxis High  03/21/2017  Past Updates...   Levofloxacin Other (comments)   03/21/2017  Past Updates...   Vicodin [Hydrocodone-acetaminophen] Other (comments)   03/21/2017  Past Updates...                [] NKA          Code Status:  [x] Full Code  [] DNR  [] Other           HBsAg ONLY: Date Drawn 03/22/17         []Negative []Positive [x]Unknown   HBsAb: Date 03/22/17    []  Susceptible   [] Immune10 []Not Drawn  [x] Drawn      Current Labs:    Date of Labs:          Today [x]        Results for KALE, RONDEAU (MRN [id number]) as of 03/22/2017 11:51   Ref. Range 03/22/2017 02:46   WBC Latest Ref Range: 4.6 - 13.2 K/uL 4.3 (L)   RBC Latest Ref Range: 4.70 - 5.50 M/uL 3.15 (L)   HGB Latest Ref Range: 13.0 - 16.0 g/dL 9.9 (L)   HCT Latest Ref Range: 36.0 - 48.0 % 29.4 (L)   MCV Latest Ref Range: 74.0 - 97.0 FL 93.3   MCH Latest Ref Range: 24.0 - 34.0 PG 31.4   MCHC Latest Ref Range: 31.0 - 37.0 g/dL 33.7   RDW Latest Ref Range: 11.6 - 14.5 % 14.2   PLATELET Latest Ref Range: 135 - 420 K/uL 100 (L)   MPV Latest Ref Range: 9.2 - 11.8 FL 10.8   NEUTROPHILS Latest Ref Range: 40 - 73 % 44   LYMPHOCYTES Latest Ref Range: 21 - 52 % 35   MONOCYTES Latest Ref Range: 3 - 10 % 14 (H)   EOSINOPHILS Latest Ref Range: 0 - 5 % 7 (H)   BASOPHILS Latest Ref Range: 0 - 2 % 0   DF Latest Units:   AUTOMATED   ABS. NEUTROPHILS Latest Ref Range: 1.8 - 8.0 K/UL 2.0   ABS. LYMPHOCYTES Latest Ref Range: 0.9 - 3.6 K/UL 1.5   ABS. MONOCYTES Latest Ref Range: 0.05 - 1.2 K/UL 0.6   ABS. EOSINOPHILS Latest Ref Range: 0.0 - 0.4 K/UL 0.3   ABS. BASOPHILS Latest Ref Range: 0.0 - 0.1 K/UL 0.0   INR Latest Ref Range: 0.8 - 1.2   1.2   Prothrombin time Latest Ref Range: 11.5 - 15.2 sec 14.2   aPTT Latest Ref Range: 23.0 - 36.4 SEC 37.1 (H)   Sodium Latest Ref Range: 136 - 145 mmol/L 140   Potassium Latest Ref Range: 3.5 - 5.5 mmol/L 4.0   Chloride Latest Ref Range: 100 - 108 mmol/L 98 (L)   CO2 Latest Ref Range: 21 - 32 mmol/L 25   Anion gap Latest Ref Range: 3.0 - 18 mmol/L 17   Glucose Latest Ref Range: 74 - 99 mg/dL 129 (H)   BUN Latest Ref Range: 7.0 - 18 MG/DL 70 (H)   Creatinine Latest Ref Range: 0.6 - 1.3 MG/DL 19.80 (H)   BUN/Creatinine ratio Latest Ref Range: 12 - 20   4 (L)   Calcium Latest Ref Range: 8.5 - 10.1 MG/DL 8.8   Phosphorus Latest Ref Range: 2.5 - 4.9 MG/DL 6.0 (H)   Magnesium Latest Ref Range: 1.6 - 2.6 mg/dL 3.1 (H)    GFR est non-AA Latest Ref Range: >60 ml/min/1.47m 3 (L)   GFR est AA Latest Ref Range: >60 ml/min/1.742m3 (L)   Bilirubin, total Latest Ref Range: 0.2 - 1.0 MG/DL 0.5   Protein, total Latest Ref Range: 6.4 - 8.2 g/dL 7.2   Albumin Latest Ref Range: 3.4 - 5.0 g/dL 3.2 (L)   Globulin Latest Ref Range: 2.0 - 4.0 g/dL 4.0   A-G Ratio Latest Ref Range: 0.8 - 1.7   0.8   ALT (SGPT) Latest Ref Range: 16 - 61 U/L 80 (H)   AST Latest Ref Range: 15 - 37 U/L 32   Alk. phosphatase Latest Ref Range: 45 - 117 U/L 64   Triglyceride Latest  Ref Range: <150 MG/DL 72   Cholesterol, total Latest Ref Range: <200 MG/DL 89   HDL Cholesterol Latest Ref Range: 40 - 60 MG/DL 43   CHOL/HDL Ratio Latest Ref Range: 0 - 5.0   2.1   LDL, calculated Latest Ref Range: 0 - 100 MG/DL 31.6   VLDL, calculated Latest Units: MG/DL 14.4   PTH INTACT Unknown    HEP B SURFACE AG Unknown Rpt   HCV RNA BY NAA QL,RFLX TO QT Unknown Rpt   HIT PANEL Unknown Rpt                                                                                DIET:  [x] Renal    [] Other     [] NPO     []  Diabetic      PRIMARY NURSE REPORT: First initial/Last name/Title      Pre Dialysis: J. Threatt, RN    Time: 1007      EDUCATION:    [x] Patient [] Other         Knowledge Basis: []None []Minimal [x] Substantial   Barriers to learning  [][name]   [x] Access Care     [] S&S of infection     [] Fluid Management     []K+     [x]Procedural    []Albumin     [] Medications     [] Tx Options     [] Transplant     [] Diet     [] Other   Teaching Tools:  [] Explain  [] Demo  [] Handouts [] Video  Patient response:   [x] Verbalized understanding  [] Teach back  [] Return demonstration [] Requires follow up   Inappropriate due to            [x]Time Out/Safety Check  [x]Extracorporeal Circuit Tested for integrity       RO/HEMODIALYSIS MACHINE SAFETY CHECKS ??? Before each treatment:     Machine Number:                   [place]                                   [x] Unit Machine # 4 with centralized RO                                      Alarm Test:  Pass time 0930         Other:         [x] RO/Machine Log Complete      Temp    36.7             Dialysate: pH  7.4 Conductivity: Meter   13.9     HD Machine [x][x][x][x][x][x][x][x][x][x][x][x][x][x][x][x][x][x][x][x][x][x][x][x][x][x][x][x][x][x][x][x][x][x][x][x][x][x][x][x][x][x][x][x][x][x][x][x][x][x][x][x][x][x][x][x][x][x][x][x][x][x][x]

## 2017-03-22 NOTE — Other (Signed)
Bedside and Verbal shift change report given to [place], Charity fundraiser (Cabin crew) by [employer], RN (offgoing nurse). Report included the following information SBAR and Kardex.

## 2017-03-23 LAB — METABOLIC PANEL, BASIC
Anion gap: 11 mmol/L (ref 3.0–18)
BUN/Creatinine ratio: 3 — ABNORMAL LOW (ref 12–20)
BUN: 35 MG/DL — ABNORMAL HIGH (ref 7.0–18)
CO2: 26 mmol/L (ref 21–32)
Calcium: 9.1 MG/DL (ref 8.5–10.1)
Chloride: 103 mmol/L (ref 100–108)
Creatinine: 11.9 MG/DL — ABNORMAL HIGH (ref 0.6–1.3)
GFR est AA: 5 mL/min/{1.73_m2} — ABNORMAL LOW (ref >60)
GFR est non-AA: 5 mL/min/{1.73_m2} — ABNORMAL LOW (ref >60)
Glucose: 86 mg/dL (ref 74–99)
Potassium: 4.4 mmol/L (ref 3.5–5.5)
Sodium: 140 mmol/L (ref 136–145)

## 2017-03-23 LAB — CBC W/O DIFF
HCT: 29.8 % — ABNORMAL LOW (ref 36.0–48.0)
HGB: 10 g/dL — ABNORMAL LOW (ref 13.0–16.0)
MCH: 31.9 PG (ref 24.0–34.0)
MCHC: 33.6 g/dL (ref 31.0–37.0)
MCV: 95.2 FL (ref 74.0–97.0)
MPV: 10.4 FL (ref 9.2–11.8)
PLATELET: 92 10*3/uL — ABNORMAL LOW (ref 135–420)
RBC: 3.13 M/uL — ABNORMAL LOW (ref 4.70–5.50)
RDW: 14.3 % (ref 11.6–14.5)
WBC: 4 10*3/uL — ABNORMAL LOW (ref 4.6–13.2)

## 2017-03-23 LAB — PTH INTACT
Calcium: 8.9 MG/DL (ref 8.5–10.1)
PTH, Intact: 307.4 pg/mL — ABNORMAL HIGH (ref 18.4–88.0)

## 2017-03-23 MED ORDER — ISOSORBIDE MONONITRATE SR 30 MG 24 HR TAB
30 mg | ORAL_TABLET | Freq: Every day | ORAL | 0 refills | Status: DC
Start: 2017-03-23 — End: 2017-03-23

## 2017-03-23 MED ORDER — ISOSORBIDE MONONITRATE SR 30 MG 24 HR TAB
30 mg | ORAL_TABLET | Freq: Every day | ORAL | 0 refills | Status: DC
Start: 2017-03-23 — End: 2017-06-23

## 2017-03-23 MED ORDER — DOCUSATE SODIUM 100 MG CAP
100 mg | ORAL_CAPSULE | Freq: Two times a day (BID) | ORAL | 0 refills | Status: AC | PRN
Start: 2017-03-23 — End: ?

## 2017-03-23 MED ORDER — LOSARTAN 50 MG TAB
50 mg | ORAL_TABLET | Freq: Every day | ORAL | 0 refills | Status: DC
Start: 2017-03-23 — End: 2017-07-06

## 2017-03-23 MED ORDER — ATORVASTATIN 40 MG TAB
40 mg | ORAL_TABLET | Freq: Every evening | ORAL | 0 refills | Status: AC
Start: 2017-03-23 — End: ?

## 2017-03-23 MED ORDER — IPRATROPIUM 20 MCG-ALBUTEROL 100 MCG/ACTUATION MIST FOR INHALATION
20-100 mcg/actuation | Freq: Four times a day (QID) | RESPIRATORY_TRACT | 0 refills | Status: AC | PRN
Start: 2017-03-23 — End: ?

## 2017-03-23 MED ORDER — LOSARTAN 50 MG TAB
50 mg | ORAL_TABLET | Freq: Every day | ORAL | 0 refills | Status: DC
Start: 2017-03-23 — End: 2017-03-23

## 2017-03-23 MED ORDER — HYDRALAZINE 100 MG TAB
100 mg | ORAL_TABLET | Freq: Three times a day (TID) | ORAL | 0 refills | Status: AC
Start: 2017-03-23 — End: ?

## 2017-03-23 MED FILL — FLU VACCINE QV 2017-18 (36 MOS+)(PF) 60 MCG (15 MCG X 4)/0.5 ML IM SYRINGE: 60 mcg (15 mcg x 4)/0.5 mL | INTRAMUSCULAR | Qty: 0.5

## 2017-03-23 MED FILL — CARVEDILOL 25 MG TAB: 25 mg | ORAL | Qty: 1

## 2017-03-23 MED FILL — ISOSORBIDE MONONITRATE SR 30 MG 24 HR TAB: 30 mg | ORAL | Qty: 1

## 2017-03-23 MED FILL — HYDRALAZINE 50 MG TAB: 50 mg | ORAL | Qty: 2

## 2017-03-23 MED FILL — AMLODIPINE 10 MG TAB: 10 mg | ORAL | Qty: 1

## 2017-03-23 MED FILL — ATORVASTATIN 40 MG TAB: 40 mg | ORAL | Qty: 1

## 2017-03-23 MED FILL — LOSARTAN 50 MG TAB: 50 mg | ORAL | Qty: 1

## 2017-03-23 MED FILL — RENAL CAPS 1 MG CAPSULE: 1 mg | ORAL | Qty: 1

## 2017-03-23 MED FILL — CALCIUM ACETATE 667 MG CAP: 667 mg | ORAL | Qty: 2

## 2017-03-23 NOTE — Progress Notes (Signed)
Problem: Falls - Risk of  Goal: *Absence of Falls  Document Schmid Fall Risk and appropriate interventions in the flowsheet.   Outcome: Progressing Towards Goal  Fall Risk Interventions:            Medication Interventions: Bed/chair exit alarm, Patient to call before getting OOB, Teach patient to arise slowly    Elimination Interventions: Call light in reach, Toileting schedule/hourly rounds

## 2017-03-23 NOTE — Progress Notes (Signed)
Mr. Doro and I took a nice walk around the unit while we talked. He shared some harrowing experiences with me, and we processed his feelings about his illness and his faith. I invited him to call us if he needed anything else before he goes home today.     Chaplain Drue Stager, M.Div, CPE Resident   Pager: [phone]  Phone: [phone]

## 2017-03-23 NOTE — Progress Notes (Signed)
Progress Note    William Ruiz  51 y.o.      Admit Date: 03/21/2017  Principal Problem:    Encounter for dialysis [facility]) (03/21/2017) POA: Yes    Active Problems:    Essential hypertension (03/21/2017) POA: Yes      Mixed hyperlipidemia (03/21/2017) POA: Yes      ESRD (end stage renal disease) (HCC) (03/23/2017) POA: Yes      Hyperphosphatemia (03/23/2017) POA: Yes            Subjective:     Patient feels good, no SOB, clear mental status      A comprehensive review of systems was negative except for that written in the History of Present Illness.    Objective:     Visit Vitals   ??? BP 150/74 (BP 1 Location: Right arm, BP Patient Position: At rest)   ??? Pulse 68   ??? Temp 96.8 ??F (36 ??C)   ??? Resp 16   ??? Ht  (1.702 m)   ??? Wt 65.4 kg (144 lb 1.6 oz)   ??? SpO2 97%   ??? BMI 22.57 kg/m2         Intake/Output Summary (Last 24 hours) at 03/23/17 1041  Last data filed at 03/23/17 0400   Gross per 24 hour   Intake              440 ml   Output             1740 ml   Net            -1300 ml       Current Facility-Administered Medications   Medication Dose Route Frequency Provider Last Rate Last Dose   ??? losartan (COZAAR) tablet 50 mg  50 mg Oral DAILY Loreli Dollar, MD   50 mg at 03/23/17 4098   ??? amLODIPine (NORVASC) tablet 10 mg  10 mg Oral DAILY Loreli Dollar, MD   10 mg at 03/23/17 0828   ??? B complex-vitaminC-folic acid (NEPHROCAP) cap  1 Cap Oral DAILY Loreli Dollar, MD   1 Cap at 03/23/17 [phone]   ??? calcium acetate (PHOSLO) capsule 1,334 mg  2 Cap Oral TID WITH MEALS Loreli Dollar, MD   1,334 mg at 03/23/17 4782   ??? atorvastatin (LIPITOR) tablet 40 mg  40 mg Oral QHS Loreli Dollar, MD   40 mg at 03/22/17 2150   ??? epoetin alfa (EPOGEN;PROCRIT) injection 4,000 Units  4,000 Units IntraVENous DIALYSIS MON, WED & Serafina Royals, MD   4,000 Units at 03/22/17 1302   ??? hydrALAZINE (APRESOLINE) tablet 100 mg  100 mg Oral TID Roe Coombs, MD   100 mg at 03/23/17 9562   ??? carvedilol (COREG) tablet 25 mg  25 mg Oral BID Roe Coombs, MD    25 mg at 03/23/17 1308   ??? ondansetron (ZOFRAN) injection 4 mg  4 mg IntraVENous Q6H PRN Roe Coombs, MD       ??? docusate sodium (COLACE) capsule 100 mg  100 mg Oral BID PRN Roe Coombs, MD       ??? isosorbide mononitrate ER (IMDUR) tablet 30 mg  30 mg Oral DAILY Roe Coombs, MD   30 mg at 03/23/17 0827   ??? influenza vaccine 2017-18 (3 yrs+)(PF) (FLUZONE QUAD/FLUARIX QUAD) injection 0.5 mL  0.5 mL IntraMUSCular PRIOR TO DISCHARGE Roe Coombs, MD            Physical Exam:  Physical Exam:   General:  Alert, cooperative, no distress, appears stated age.   Neck: Supple, symmetrical, trachea midline, no adenopathy, thyroid: no enlargement/tenderness/nodules, no carotid bruit and no JVD.   Lungs:   Clear to auscultation bilaterally.   Heart:  Regular rate and rhythm, S1, S2 normal, no murmur, click, rub or gallop.   Abdomen:   Soft, non-tender. Bowel sounds normal. No masses,  No organomegaly.   Extremities: Extremities normal, atraumatic, no cyanosis or edema, Right arm AVG with good.         Data Review:    CBC w/Diff    Recent Labs      03/23/17   0243  03/22/17   0246  03/21/17   1115   WBC  4.0*  4.3*  4.8   RBC  3.13*  3.15*  3.13*   HGB  10.0*  9.9*  9.8*   HCT  29.8*  29.4*  29.1*   MCV  95.2  93.3  93.0   MCH  31.9  31.4  31.3   MCHC  33.6  33.7  33.7   RDW  14.3  14.2  14.0    Recent Labs      03/23/17   0243  03/22/17   0246  03/21/17   1115   MONOS   --   14*  8   EOS   --   7*  5   BASOS   --   0  0   RDW  14.3  14.2  14.0        Comprehensive Metabolic Profile    Recent Labs      03/23/17   0930  03/22/17   0246  03/21/17   1115   NA  140  140  139   K  4.4  4.0  4.0   CL  103  98*  96*   CO2  BUN  35*  70*  68*   CREA  11.90*  19.80*  18.50*    Recent Labs      03/23/17   0930  03/22/17   0246  03/21/17   1115   CA  9.1  8.9   8.8  8.9   PHOS   --   6.0*   --    ALB   --   3.2*   --    TP   --   7.2   --    SGOT   --   32   --    TBILI   --   0.5   --                          Impression:       Active Hospital Problems    Diagnosis Date Noted   ??? ESRD (end stage renal disease) (HCC) 03/23/2017   ??? Hyperphosphatemia 03/23/2017   ??? Encounter for dialysis (HCC) 03/21/2017   ??? Essential hypertension 03/21/2017   ??? Mixed hyperlipidemia 03/21/2017            Plan:     Stable to be Discharged today & start OP dialysis tomorrow with [facility] ,[address]  [place] [address] [place] unit.at 2.15 PM. Discussed with Hospitalist Service.      Loreli Dollar, MD

## 2017-03-23 NOTE — Discharge Summary (Signed)
[facility]ist [facility]  Discharge Summary       Patient: William Ruiz Age: 51 y.o. DOB: 12/05/1966 MR#: [id number] SSN: [id number]  PCP on record: Quay Burow, MD  Admit date: 03/21/2017  Discharge date: 03/23/2017    Disposition:    Home   Home with Home Health   SNF/NH   Rehab   Home with family   Alternate Facility:____________________    Discharge Diagnoses:                             1. ESRD on Hemodialysis long term   2.  H/o Coronary Artery Disease   3.  Hypertension   4.  Hepatitis C - chronic      Discharge Medications:     Current Discharge Medication List      START taking these medications    Details   docusate sodium (COLACE) 100 mg capsule Take 1 Cap by mouth two (2) times daily as needed for Constipation.  Qty: 30 Cap, Refills: 0      isosorbide mononitrate ER (IMDUR) 30 mg tablet Take 1 Tab by mouth daily.  Qty: 30 Tab, Refills: 0      losartan (COZAAR) 50 mg tablet Take 1 Tab by mouth daily.  Qty: 30 Tab, Refills: 0         CONTINUE these medications which have CHANGED    Details   atorvastatin (LIPITOR) 40 mg tablet Take 1 Tab by mouth nightly.  Qty: 30 Tab, Refills: 0      hydrALAZINE (APRESOLINE) 100 mg tablet Take 1 Tab by mouth three (3) times daily.  Qty: 90 Tab, Refills: 0      ipratropium-albuterol (COMBIVENT RESPIMAT) 20-100 mcg/actuation inhaler Take 1 Puff by inhalation every six (6) hours as needed for Wheezing.  Qty: 1 Inhaler, Refills: 0         CONTINUE these medications which have NOT CHANGED    Details   carvedilol (COREG) 25 mg tablet Take 25 mg by mouth two (2) times a day.      calcium acetate (PHOSLO) 667 mg tab tablet Take 1,334 mg by mouth three (3) times daily (with meals).      cinacalcet (SENSIPAR) 60 mg tab Take 60 mg by mouth.      amLODIPine (NORVASC) 10 mg tablet Take 10 mg by mouth daily.      b complex-vitamin c-folic acid (NEPHROCAPS) 1 mg capsule Take 1 Cap by mouth daily.       aspirin delayed-release 81 mg tablet Take 81 mg by mouth every other day.         STOP taking these medications       b complex-vitamin c-folic acid 0.8 mg (DIALYVITE 800) 0.8 mg tab tablet Comments:   Reason for Stopping:               Consults:  Nephrology  -   Procedures: none  -     Significant Diagnostic Studies: none  -      Hospital Course by Problem   1. ESRD on Hemodialysis long term: Pt was admitted for HD and establishing dialysis center as an OP after discharge. states on HD since he was 51 y/o.  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 Nephrology was consulted, and pt completed HD x1, Pt will go to [facility], [place],  [place] & [place]. Pt will have his  next dialysis tomorrow at 2:15. Pt reports feeling well prior to discharge.   2.  H/o Coronary Artery Disease - Per cardiac cath during hospitalization at [facility] on 02/24/2017 with moderate CAD - ramus 40%, prox to mid LAD 40%, prox RCA 25%, PDA 50%. Follow-up with Dr. Chales Abrahams while here in [place].   3.  Hypertension: managed with BP meds with improvement.   4.  Hepatitis C - chronic: advised to follow with GI specialist as OP.     Today's examination of the patient revealed:     Subjective:   Pt reports feeling well today. Denies any chest pain, SOB, N/V/D/C, or abd pain.   Objective:   VS:   Visit Vitals   ??? BP 133/67 (BP 1 Location: Right arm, BP Patient Position: At rest)   ??? Pulse 66   ??? Temp 96.5 ??F (35.8 ??C)   ??? Resp 16   ??? Ht  (1.702 m)   ??? Wt 65.4 kg (144 lb 1.6 oz)   ??? SpO2 97%   ??? BMI 22.57 kg/m2      Tmax/24hrs: Temp (24hrs), Avg:97.7 ??F (36.5 ??C), Min:96.5 ??F (35.8 ??C), Max:99.5 ??F (37.5 ??C)     Input/Output:   Intake/Output Summary (Last 24 hours) at 03/23/17 1547  Last data filed at 03/23/17 0400   Gross per 24 hour   Intake              440 ml   Output                0 ml   Net              440 ml       General:  Alert, NAD  Cardiovascular:  RRR  Pulmonary:  LSC throughout; respiratory effort WNL   GI:  +BS in all four quadrants, soft, non-tender  Extremities:  No edema; or cyanosis  Additional:      Labs:    Recent Results (from the past 24 hour(s))   CBC W/O DIFF    Collection Time: 03/23/17  2:43 AM   Result Value Ref Range    WBC 4.0 (L) 4.6 - 13.2 K/uL    RBC 3.13 (L) 4.70 - 5.50 M/uL    HGB 10.0 (L) 13.0 - 16.0 g/dL    HCT 08.6 (L) 57.8 - 48.0 %    MCV 95.2 74.0 - 97.0 FL    MCH 31.9 24.0 - 34.0 PG    MCHC 33.6 31.0 - 37.0 g/dL    RDW 46.9 62.9 - 52.8 %    PLATELET 92 (L) 135 - 420 K/uL    MPV 10.4 9.2 - 11.8 FL   METABOLIC PANEL, BASIC    Collection Time: 03/23/17  9:30 AM   Result Value Ref Range    Sodium 140 136 - 145 mmol/L    Potassium 4.4 3.5 - 5.5 mmol/L    Chloride 103 100 - 108 mmol/L    CO2 26 21 - 32 mmol/L    Anion gap 11 3.0 - 18 mmol/L    Glucose 86 74 - 99 mg/dL    BUN 35 (H) 7.0 - 18 MG/DL    Creatinine 41.32 (H) 0.6 - 1.3 MG/DL    BUN/Creatinine ratio 3 (L) 12 - 20      GFR est AA 5 (L) >60 ml/min/1.68m2    GFR est non-AA 5 (L) >60 ml/min/1.85m2    Calcium 9.1 8.5 - 10.1 MG/DL  Additional Data Reviewed:     Condition:   Follow-up Appointments:   1. Your PCP: Quay Burow, MD, within 7-10days  2. Your Cardiologist: Venda Rodes, MD within 2 weeks  3. Your Nephrologist: Loreli Dollar, MD within 2 weeks  4. Your Gastroenterologist- Liver Specialist: Kalman Shan, MD within 2 weeks - will see pt after out of state insurance is changed.     >30 minutes spent coordinating this discharge (review instructions/follow-up, prescriptions, preparing report for sign off)    Signed:  Clayborne Dana, PA-C  03/23/2017  3:47 PM

## 2017-03-23 NOTE — Other (Incomplete)
1930-Bedside and verbal report given to myself from [place], [employer] fundraiser. Pt resting in bed, alert and oriented times 4, no c/o pain, steady gait, self care, bed low for safety.     2030-Pt resting in bed, NAD,. Call bell within reach, no c/o pain, bed low for safety.     2130-Pt resting, NAD, no apparent pain, call bell within reach, bed low for safety. Vital signs stable. Snack given and cup of ice.     2230-Pt resting, NAD, BP medications given after recheck. NAD. No apparent pain, call bell within reach, bed low for safety.     0000-Pt resting in bed asleep, no apparent pain, call bell within reach, bed low for safety.     0200-Pt sleeping, NAD, no apparent pain, call bell within reach, bed low for safety.     0400-Pt sleeping, NAD, no apparent pain, call bell within reach, bed low for safety.     0600-Pt sleeping, NAD, no apparent pain, call bell within reach, bed low for safety.     0700-Bedside and verbal report given to           From myself Alvis Lemmings, RN. Report included, recent results, MAR, assessment notes, etc.

## 2017-03-23 NOTE — Progress Notes (Addendum)
Care Management Interventions  PCP Verified by CM: No  Mode of Transport at Discharge: Other (see comment) (family)  Transition of Care Consult (CM Consult): Discharge Planning  Current Support Network: Relative's Home  Confirm Follow Up Transport: Self  Plan discussed with Pt/Family/Caregiver: Yes  Discharge Location  Discharge Placement: Home     Called [facility] admissions and spoke to [facility].  She states pt should be ready to be discharged today. She states she has 2 chair time and she is going to call pt and ask him which chair time he prefers and she is going to call me back.      1000:  Pt signed the MOON and OBS forms.  Original filed in pt's blue chart while copies given to pt.    1100:  Called [facility] admissions and she states pt is being set up for dialysis MWF on [address] and pt is aware.

## 2017-03-23 NOTE — Discharge Summary (Signed)
Discharge Summary by Clayborne Dana, PA-C at 03/23/17 1547                Author: Clayborne Dana, PA-C  Service: Internal Medicine  Author Type: Physician Assistant       Filed: 03/23/17 1607  Date of Service: 03/23/17 1547  Status: Attested           Editor: Clayborne Dana, PA-C (Physician Assistant)  Cosigner: Wanda Plump, MD at 03/27/17 [phone]          Attestation signed by Wanda Plump, MD at 03/27/17 [phone]          Patient independently interviewed , examined and agreed with finding per PA note.  Neccessary changes if any will be mentioned below.   Case D/W PA seeing patient        Wanda Plump, MD   9:36 AM                                       Sondra Barg[facility]   Discharge Summary             Patient: William Ruiz  Age: 51 y.o.  DOB: [birth year] MR#:  [id number] SSN:  [id number]   PCP on record: Quay Burow, MD   Admit date: 03/21/2017   Discharge date: 03/23/2017        Disposition:      Home   Home with Home Health   SNF/NH   Rehab   Home with family     Alternate Facility:____________________      Discharge Diagnoses:                              1. ESRD on Hemodialysis long term    2.  H/o Coronary Artery Disease    3.  Hypertension    4.  Hepatitis C - chronic           Discharge Medications:          Current Discharge Medication List              START taking these medications          Details        docusate sodium (COLACE) 100 mg capsule  Take 1 Cap by mouth two (2) times daily as needed for Constipation.   Qty: 30 Cap, Refills:  0               isosorbide mononitrate ER (IMDUR) 30 mg tablet  Take 1 Tab by mouth daily.   Qty: 30 Tab, Refills:  0               losartan (COZAAR) 50 mg tablet  Take 1 Tab by mouth daily.   Qty: 30 Tab, Refills:  0                     CONTINUE these medications which have CHANGED          Details        atorvastatin (LIPITOR) 40 mg tablet  Take 1 Tab by mouth nightly.   Qty: 30 Tab, Refills:  0                hydrALAZINE (APRESOLINE) 100 mg tablet  Take 1 Tab by mouth three (3) times daily.   Qty: 90  Tab, Refills:  0               ipratropium-albuterol (COMBIVENT RESPIMAT) 20-100 mcg/actuation inhaler  Take 1 Puff by inhalation every six (6) hours as needed for Wheezing.   Qty: 1 Inhaler, Refills:  0                     CONTINUE these medications which have NOT CHANGED          Details        carvedilol (COREG) 25 mg tablet  Take 25 mg by mouth two (2) times a day.               calcium acetate (PHOSLO) 667 mg tab tablet  Take 1,334 mg by mouth three (3) times daily (with meals).               cinacalcet (SENSIPAR) 60 mg tab  Take 60 mg by mouth.               amLODIPine (NORVASC) 10 mg tablet  Take 10 mg by mouth daily.               b complex-vitamin c-folic acid (NEPHROCAPS) 1 mg capsule  Take 1 Cap by mouth daily.               aspirin delayed-release 81 mg tablet  Take 81 mg by mouth every other day.                     STOP taking these medications                  b complex-vitamin c-folic acid 0.8 mg (DIALYVITE 800) 0.8 mg tab tablet  Comments:    Reason for Stopping:                             Consults:  Nephrology   -    Procedures: none   -      Significant Diagnostic Studies: none   -          Hospital Course by Problem     1. ESRD on Hemodialysis long term: Pt was admitted for HD and establishing dialysis center as an OP after discharge. states on HD since he was 50 y/o.  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 Nephrology was consulted, and pt completed HD x1, Pt will go to [facility], [place],  [place] ,[place]. Pt will have his next dialysis tomorrow at 2:15. Pt reports feeling well prior to  discharge.    2.  H/o Coronary Artery Disease - Per cardiac cath during hospitalization at [facility] on 02/24/2017 with moderate CAD - ramus 40%, prox to mid LAD 40%, prox RCA 25%, PDA 50%. Follow-up with Dr. Chales Abrahams while here in [place].    3.  Hypertension: managed with BP meds with  improvement.    4.  Hepatitis C - chronic: advised to follow with GI specialist as OP.         Today's examination of the patient revealed:          Subjective:     Pt reports feeling well today. Denies any chest pain, SOB, N/V/D/C, or abd pain.      Objective:     VS:      Visit Vitals         ?  BP  133/67 (BP 1 Location: Right arm, BP Patient Position: At rest)     ?  Pulse  66     ?  Temp  96.5 ??F (35.8 ??C)     ?  Resp  16     ?  Ht   (1.702 m)     ?  Wt  65.4 kg (144 lb 1.6 oz)     ?  SpO2  97%         ?  BMI  22.57 kg/m2         Tmax/24hrs: Temp (24hrs), Avg:97.7 ??F (36.5 ??C), Min:96.5 ??F  (35.8 ??C), Max:99.5 ??F (37.5 ??C)       Input/Output:    Intake/Output Summary (Last 24 hours) at 03/23/17 1547  Last data filed at 03/23/17 0400        Gross per 24 hour     Intake               440 ml     Output                 0 ml     Net               440 ml           General:  Alert, NAD   Cardiovascular:  RRR   Pulmonary:  LSC throughout; respiratory effort WNL   GI:  +BS in all four quadrants, soft, non-tender   Extremities:  No edema; or cyanosis   Additional:        Labs:       Recent Results (from the past 24 hour(s))     CBC W/O DIFF          Collection Time: 03/23/17  2:43 AM         Result  Value  Ref Range            WBC  4.0 (L)  4.6 - 13.2 K/uL       RBC  3.13 (L)  4.70 - 5.50 M/uL       HGB  10.0 (L)  13.0 - 16.0 g/dL       HCT  28.4 (L)  13.2 - 48.0 %       MCV  95.2  74.0 - 97.0 FL       MCH  31.9  24.0 - 34.0 PG       MCHC  33.6  31.0 - 37.0 g/dL       RDW  44.0  10.2 - 14.5 %       PLATELET  92 (L)  135 - 420 K/uL       MPV  10.4  9.2 - 11.8 FL       METABOLIC PANEL, BASIC          Collection Time: 03/23/17  9:30 AM         Result  Value  Ref Range            Sodium  140  136 - 145 mmol/L       Potassium  4.4  3.5 - 5.5 mmol/L       Chloride  103  100 - 108 mmol/L       CO2  26  21 - 32 mmol/L       Anion gap  11  3.0 - 18 mmol/L       Glucose  86  74 -  99 mg/dL       BUN  35 (H)  7.0 - 18 MG/DL        Creatinine  16.10 (H)  0.6 - 1.3 MG/DL       BUN/Creatinine ratio  3 (L)  12 - 20         GFR est AA  5 (L)  >60 ml/min/1.85m2       GFR est non-AA  5 (L)  >60 ml/min/1.64m2            Calcium  9.1  8.5 - 10.1 MG/DL        Additional Data Reviewed:       Condition:      Follow-up Appointments:     1. Your PCP: Quay Burow, MD, within 7-10days   2. Your Cardiologist: Venda Rodes, MD within 2 weeks   3. Your Nephrologist: Loreli Dollar, MD within 2 weeks   4. Your Gastroenterologist- Liver Specialist: Kalman Shan, MD within 2 weeks - will see pt after out of state insurance is changed.       >30 minutes spent coordinating this discharge (review instructions/follow-up, prescriptions, preparing report for sign off)      Signed:   Clayborne Dana, PA-C   03/23/2017   3:47 PM

## 2017-03-25 LAB — HIT PANEL
HEPARIN AGGREGATION: NEGATIVE
PLATELET FACTOR 4: NEGATIVE

## 2017-03-27 LAB — HCV RNA BY NAA QL,RFLX TO QT
HCV log 10: 6.38 HCV log 10IU/Ml — AB
HCV, IU/mL: 2422487 HCVIU/mL — AB
Hepatitis C RNA, QL, NAA: POSITIVE — AB

## 2017-03-30 ENCOUNTER — Encounter: Attending: Family | Primary: Family Medicine

## 2017-04-07 ENCOUNTER — Encounter: Payer: Medicaid Other | Admitting: Internal Medicine

## 2017-05-01 IMAGING — NM NM MYOCAR MULTI W/SPECT W/WALL MOTION & EF
1 series · 6 of 6 positions shown · non-contrast
Comparison: None.

CLINICAL DATA: Chest pain.  Diabetes and hypertension.

EXAM:
MYOCARDIAL IMAGING WITH SPECT (REST AND PHARMACOLOGIC-STRESS)
GATED LEFT VENTRICULAR WALL MOTION STUDY
LEFT VENTRICULAR EJECTION FRACTION
TECHNIQUE: Standard myocardial SPECT imaging was performed after resting
intravenous injection of 10 mCi 2c-11m tetrofosmin. Subsequently,
intravenous infusion of Lexiscan was performed under the supervision
of the Cardiology staff. At peak effect of the drug, 30 mCi 2c-11m
tetrofosmin was injected intravenously and standard myocardial SPECT
imaging was performed. Quantitative gated imaging was also performed
to evaluate left ventricular wall motion, and estimate left
ventricular ejection fraction.

[Series 1: rest · 8.28mm/px · 6 of 64 frames shown]
[frame 6/64]
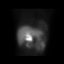
[frame 16/64]
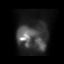
[frame 27/64]
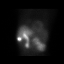
[frame 38/64]
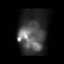
[frame 48/64]
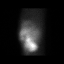
[frame 59/64]
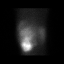

[6 of 6 positions shown; findings below may reference images not displayed]

FINDINGS: Perfusion: Large myocardial perfusion defect throughout the inferior
wall of the left ventricle on stress imaging, which shows no
evidence of reversibility on resting images. This is consistent with
myocardial infarction. No reversible myocardial perfusion defects
are seen to suggest reversible ischemia.

Wall Motion: Severe inferior and apical wall hypokinesis. Moderate
to severe left ventricular dilatation.

Left Ventricular Ejection Fraction: 43 %

End diastolic volume 237 ml

End systolic volume 134 ml
IMPRESSION: 1. No reversible ischemia demonstrated. Large inferior wall
myocardial infarction.

2. Severe inferior and apical wall hypokinesis, with moderate to
severe left ventricular dilatation.

3. Left ventricular ejection fraction 43%

4. Non invasive risk stratification*: Intermediate

*5285 Appropriate Use Criteria for Coronary Revascularization
Focused Update: J Am Coll Cardiol. 5285;59(9):857-881.
[URL]

## 2017-05-10 ENCOUNTER — Encounter: Attending: Specialist | Primary: Family Medicine

## 2017-05-16 ENCOUNTER — Inpatient Hospital Stay
Admit: 2017-05-16 | Discharge: 2017-05-16 | Disposition: A | Discharge status: Home / Self Care | Payer: MEDICAID | Attending: Emergency Medicine

## 2017-05-16 DIAGNOSIS — Z09 Encounter for follow-up examination after completed treatment for conditions other than malignant neoplasm: Secondary | ICD-10-CM

## 2017-05-16 NOTE — ED Triage Notes (Signed)
Patient is here to get sutures removed. Patient states it should have been removed four days ago.

## 2017-05-16 NOTE — ED Notes (Signed)
I have reviewed discharge instructions with the patient.  The patient verbalized understanding.  Patient armband removed and shredded  Pt left ED ambulatory, alert and in NAD.

## 2017-05-16 NOTE — ED Provider Notes (Signed)
EMERGENCY DEPARTMENT HISTORY AND PHYSICAL EXAM    9:29 AM      Date: 05/16/2017  Patient Name: William Ruiz    History of Presenting Illness     Chief Complaint   Patient presents with   ??? Suture Removal         History Provided By: Patient    Chief Complaint: suture eval  Duration:  patient reports approximately 2 weeks ago  Location: shunt to right upper arm  Quality: denies pain discomfort  Severity: [name]  Modifying Factors: none  Associated Symptoms: denies any other associated signs or symptoms      Additional History (Context): William Ruiz is a 51 y.o. male with a PMHX ESRD, Benign Essential HTN, and HLD arrived to ER for evaluation of right upper arm suture.  Patient reports he had a Fistulagram to right upper arm approximately 2 weeks ago and one suture placed.  States, "it needs to come out."  Patient denies arm pain, swelling, or erythema.  Patient reports he receives dialysis M/W/F.  Patient denies fever, chills, headache, dizziness, Cp, SOB, abdominal pain, or any other concerns.     PCP: William Burow, MD    Current Outpatient Prescriptions   Medication Sig Dispense Refill   ??? atorvastatin (LIPITOR) 40 mg tablet Take 1 Tab by mouth nightly. 30 Tab 0   ??? docusate sodium (COLACE) 100 mg capsule Take 1 Cap by mouth two (2) times daily as needed for Constipation. 30 Cap 0   ??? hydrALAZINE (APRESOLINE) 100 mg tablet Take 1 Tab by mouth three (3) times daily. 90 Tab 0   ??? ipratropium-albuterol (COMBIVENT RESPIMAT) 20-100 mcg/actuation inhaler Take 1 Puff by inhalation every six (6) hours as needed for Wheezing. 1 Inhaler 0   ??? isosorbide mononitrate ER (IMDUR) 30 mg tablet Take 1 Tab by mouth daily. 30 Tab 0   ??? losartan (COZAAR) 50 mg tablet Take 1 Tab by mouth daily. 30 Tab 0   ??? carvedilol (COREG) 25 mg tablet Take 25 mg by mouth two (2) times a day.     ??? calcium acetate (PHOSLO) 667 mg tab tablet Take 1,334 mg by mouth three (3) times daily (with meals).      ??? cinacalcet (SENSIPAR) 60 mg tab Take 60 mg by mouth.     ??? amLODIPine (NORVASC) 10 mg tablet Take 10 mg by mouth daily.     ??? b complex-vitamin c-folic acid (NEPHROCAPS) 1 mg capsule Take 1 Cap by mouth daily.     ??? aspirin delayed-release 81 mg tablet Take 81 mg by mouth every other day.         Past History     Past Medical History:  Past Medical History:   Diagnosis Date   ??? Benign essential HTN    ??? ESRD (end stage renal disease) on dialysis ([facility])    ??? HLD (hyperlipidemia)        Past Surgical History:  Past Surgical History:   Procedure Laterality Date   ??? HX ARTERIOVENOUS FISTULA         Family History:  No family history on file.    Social History:  Social History   Substance Use Topics   ??? Smoking status: Not on file   ??? Smokeless tobacco: Not on file   ??? Alcohol use Not on file       Allergies:  Allergies   Allergen Reactions   ??? Penicillins Anaphylaxis   ??? Levofloxacin Other (comments)   ???  Vicodin [Hydrocodone-Acetaminophen] Other (comments)         Review of Systems     Review of Systems   Constitutional: Negative.  Negative for activity change, appetite change, chills, diaphoresis, fatigue and fever.   Respiratory: Negative for cough and shortness of breath.    Cardiovascular: Negative for chest pain.   Skin:        Suture eval to right upper arm   Neurological: Negative for dizziness, weakness, light-headedness and headaches.   All other systems reviewed and are negative.        Physical Exam     Visit Vitals   ??? BP 130/76   ??? Pulse 67   ??? Temp 97.8 ??F (36.6 ??C)   ??? Resp 15   ??? Ht 5\' 7"  (1.702 m)   ??? Wt 65.8 kg (145 lb)   ??? SpO2 97%   ??? BMI 22.71 kg/m2         Physical Exam   Constitutional: He is oriented to person, place, and time. He appears well-developed and well-nourished. No distress.   Cardiovascular: Normal rate, regular rhythm and normal heart sounds.  Exam reveals no gallop and no friction rub.    No murmur heard.  AV shunt to right upper arm.  One suture intact. No edema, erythema or  pain on palpation.    +bruit and thrill   Pulmonary/Chest: Effort normal and breath sounds normal. No respiratory distress. He has no wheezes. He has no rales. He exhibits no tenderness.   Musculoskeletal: Normal range of motion.   Neurological: He is alert and oriented to person, place, and time.   Skin: Skin is warm and dry. He is not diaphoretic.   Psychiatric: He has a normal mood and affect. His speech is normal and behavior is normal. Judgment and thought content normal. Cognition and memory are normal.   Nursing note and vitals reviewed.        Diagnostic Study Results     Labs -none  No results found for this or any previous visit (from the past 12 hour(s)).    Radiologic Studies - none  No orders to display         Medical Decision Making   I am the first provider for this patient.    I reviewed the vital signs, available nursing notes, past medical history, past surgical history, family history and social history.    Vital Signs-Reviewed the patient's vital signs.    Records Reviewed: Old Medical Records (Time of Review: 9:29 AM)    ED Course: Progress Notes, Reevaluation, and Consults:      Provider Notes (Medical Decision Making):  Patient stable condition. No sign of infection to right upper arm suture.  Will no remove suture during this ER visit. Patient is to follow up with Vascular Surgeon Dr. Marybelle Ruiz office in 2 days without failure.  Return to ER immediately for any concerns.     Diagnosis     Clinical Impression:   1. Suture check        Disposition: home    Follow-up Information     Follow up With Details Comments Contact Info    William Burow, MD Schedule an appointment as soon as possible for a visit in 2 days  [address]  [place] [address]  [phone]      William Ravens, MD Schedule an appointment as soon as possible for a visit in 2 days  [address]  [address]  [place] [id number]  [phone]       Return to ER immediately for any worsening symptoms or concerns.              Patient's Medications   Start Taking    No medications on file   Continue Taking    AMLODIPINE (NORVASC) 10 MG TABLET    Take 10 mg by mouth daily.    ASPIRIN DELAYED-RELEASE 81 MG TABLET    Take 81 mg by mouth every other day.    ATORVASTATIN (LIPITOR) 40 MG TABLET    Take 1 Tab by mouth nightly.    B COMPLEX-VITAMIN C-FOLIC ACID (NEPHROCAPS) 1 MG CAPSULE    Take 1 Cap by mouth daily.    CALCIUM ACETATE (PHOSLO) 667 MG TAB TABLET    Take 1,334 mg by mouth three (3) times daily (with meals).    CARVEDILOL (COREG) 25 MG TABLET    Take 25 mg by mouth two (2) times a day.    CINACALCET (SENSIPAR) 60 MG TAB    Take 60 mg by mouth.    DOCUSATE SODIUM (COLACE) 100 MG CAPSULE    Take 1 Cap by mouth two (2) times daily as needed for Constipation.    HYDRALAZINE (APRESOLINE) 100 MG TABLET    Take 1 Tab by mouth three (3) times daily.    IPRATROPIUM-ALBUTEROL (COMBIVENT RESPIMAT) 20-100 MCG/ACTUATION INHALER    Take 1 Puff by inhalation every six (6) hours as needed for Wheezing.    ISOSORBIDE MONONITRATE ER (IMDUR) 30 MG TABLET    Take 1 Tab by mouth daily.    LOSARTAN (COZAAR) 50 MG TABLET    Take 1 Tab by mouth daily.   These Medications have changed    No medications on file   Stop Taking    No medications on file     _______________________________    Attestations:  Scribe Attestation     Charlott RakesYolanda Keen Ewalt, NP acting as a scribe for and in the presence of Algis DownsMichael E Stull, MD      May 16, 2017 at 9:29 AM       Provider Attestation:      I personally performed the services described in the documentation, reviewed the documentation, as recorded by the scribe in my presence, and it accurately and completely records my words and actions. May 16, 2017 at 9:29 AM - Algis DownsMichael E Stull, MD    _______________________________

## 2017-06-21 ENCOUNTER — Inpatient Hospital Stay
Admit: 2017-06-21 | Discharge: 2017-06-24 | Disposition: A | Discharge status: Home / Self Care | Payer: MEDICAID | Attending: Internal Medicine | Admitting: Internal Medicine

## 2017-06-21 ENCOUNTER — Emergency Department: Admit: 2017-06-21 | Payer: MEDICAID | Primary: Family Medicine

## 2017-06-21 DIAGNOSIS — R0789 Other chest pain: Secondary | ICD-10-CM

## 2017-06-21 LAB — METABOLIC PANEL, COMPREHENSIVE
A-G Ratio: 0.7 — ABNORMAL LOW (ref 0.8–1.7)
ALT (SGPT): 112 U/L — ABNORMAL HIGH (ref 16–61)
AST (SGOT): 59 U/L — ABNORMAL HIGH (ref 15–37)
Albumin: 3.3 g/dL — ABNORMAL LOW (ref 3.4–5.0)
Alk. phosphatase: 94 U/L (ref 45–117)
Anion gap: 13 mmol/L (ref 3.0–18)
BUN/Creatinine ratio: 4 — ABNORMAL LOW (ref 12–20)
BUN: 66 MG/DL — ABNORMAL HIGH (ref 7.0–18)
Bilirubin, total: 0.4 MG/DL (ref 0.2–1.0)
CO2: 26 mmol/L (ref 21–32)
Calcium: 9.2 MG/DL (ref 8.5–10.1)
Chloride: 98 mmol/L — ABNORMAL LOW (ref 100–108)
Creatinine: 17.4 MG/DL — ABNORMAL HIGH (ref 0.6–1.3)
GFR est AA: 4 mL/min/{1.73_m2} — ABNORMAL LOW (ref >60)
GFR est non-AA: 3 mL/min/{1.73_m2} — ABNORMAL LOW (ref >60)
Globulin: 4.6 g/dL — ABNORMAL HIGH (ref 2.0–4.0)
Glucose: 137 mg/dL — ABNORMAL HIGH (ref 74–99)
Potassium: 4.6 mmol/L (ref 3.5–5.5)
Protein, total: 7.9 g/dL (ref 6.4–8.2)
Sodium: 137 mmol/L (ref 136–145)

## 2017-06-21 LAB — CBC WITH AUTOMATED DIFF
ABS. BASOPHILS: 0 10*3/uL (ref 0.0–0.06)
ABS. EOSINOPHILS: 0.4 10*3/uL (ref 0.0–0.4)
ABS. LYMPHOCYTES: 1.6 10*3/uL (ref 0.9–3.6)
ABS. MONOCYTES: 0.5 10*3/uL (ref 0.05–1.2)
ABS. NEUTROPHILS: 2.8 10*3/uL (ref 1.8–8.0)
BASOPHILS: 0 % (ref 0–2)
EOSINOPHILS: 8 % — ABNORMAL HIGH (ref 0–5)
HCT: 37.8 % (ref 36.0–48.0)
HGB: 12.9 g/dL — ABNORMAL LOW (ref 13.0–16.0)
LYMPHOCYTES: 30 % (ref 21–52)
MCH: 33.1 PG (ref 24.0–34.0)
MCHC: 34.1 g/dL (ref 31.0–37.0)
MCV: 96.9 FL (ref 74.0–97.0)
MONOCYTES: 9 % (ref 3–10)
MPV: 10.4 FL (ref 9.2–11.8)
NEUTROPHILS: 53 % (ref 40–73)
PLATELET: 113 10*3/uL — ABNORMAL LOW (ref 135–420)
RBC: 3.9 M/uL — ABNORMAL LOW (ref 4.70–5.50)
RDW: 12.8 % (ref 11.6–14.5)
WBC: 5.3 10*3/uL (ref 4.6–13.2)

## 2017-06-21 LAB — NT-PRO BNP: NT pro-BNP: 42018 PG/ML — ABNORMAL HIGH (ref 0–900)

## 2017-06-21 LAB — EKG, 12 LEAD, INITIAL
Atrial Rate: 75 {beats}/min
Calculated P Axis: 53 degrees
Calculated R Axis: -5 degrees
Calculated T Axis: -21 degrees
Diagnosis: NORMAL
P-R Interval: 166 ms
Q-T Interval: 432 ms
QRS Duration: 78 ms
QTC Calculation (Bezet): 482 ms
Ventricular Rate: 75 {beats}/min

## 2017-06-21 LAB — CARDIAC PANEL,(CK, CKMB & TROPONIN)
CK - MB: <1 ng/ml (ref <3.6)
CK: 134 U/L (ref 39–308)
Troponin-I, QT: 0.03 NG/ML (ref 0.0–0.045)

## 2017-06-21 LAB — MAGNESIUM: Magnesium: 2.5 mg/dL (ref 1.6–2.6)

## 2017-06-21 LAB — PHOSPHORUS: Phosphorus: 4 MG/DL (ref 2.5–4.9)

## 2017-06-21 LAB — EKG 12-LEAD
Atrial Rate: 75 {beats}/min
Diagnosis: NORMAL
P Axis: 53 degrees
P-R Interval: 166 ms
Q-T Interval: 432 ms
QRS Duration: 78 ms
QTc Calculation (Bazett): 482 ms
R Axis: -5 degrees
T Axis: -21 degrees
Ventricular Rate: 75 {beats}/min

## 2017-06-21 MED ORDER — AMLODIPINE 10 MG TAB
10 mg | Freq: Every day | ORAL | Status: DC
Start: 2017-06-21 — End: 2017-06-23
  Administered 2017-06-22 – 2017-06-23 (×2): via ORAL

## 2017-06-21 MED ORDER — DOCUSATE SODIUM 100 MG CAP
100 mg | Freq: Two times a day (BID) | ORAL | Status: DC | PRN
Start: 2017-06-21 — End: 2017-06-23

## 2017-06-21 MED ORDER — ATORVASTATIN 40 MG TAB
40 mg | Freq: Every evening | ORAL | Status: DC
Start: 2017-06-21 — End: 2017-06-23
  Administered 2017-06-22 – 2017-06-23 (×2): via ORAL

## 2017-06-21 MED ORDER — IPRATROPIUM-ALBUTEROL 2.5 MG-0.5 MG/3 ML NEB SOLUTION
2.5 mg-0.5 mg/3 ml | Freq: Four times a day (QID) | RESPIRATORY_TRACT | Status: DC | PRN
Start: 2017-06-21 — End: 2017-06-23

## 2017-06-21 MED ORDER — ISOSORBIDE MONONITRATE SR 30 MG 24 HR TAB
30 mg | Freq: Every day | ORAL | Status: DC
Start: 2017-06-21 — End: 2017-06-22

## 2017-06-21 MED ORDER — NITROGLYCERIN 0.4 MG SUBLINGUAL TAB
0.4 mg | Freq: Once | SUBLINGUAL | Status: AC
Start: 2017-06-21 — End: 2017-06-21
  Administered 2017-06-21: 18:00:00 via SUBLINGUAL

## 2017-06-21 MED ORDER — B COMPLEX-VITAMIN C-FOLIC ACID 1 MG CAP
1 mg | Freq: Every day | ORAL | Status: DC
Start: 2017-06-21 — End: 2017-06-23
  Administered 2017-06-23: 13:00:00 via ORAL

## 2017-06-21 MED ORDER — CALCIUM ACETATE 667 MG CAP
667 mg | Freq: Three times a day (TID) | ORAL | Status: DC
Start: 2017-06-21 — End: 2017-06-23
  Administered 2017-06-21 – 2017-06-23 (×4): via ORAL

## 2017-06-21 MED ORDER — ASPIRIN 81 MG CHEWABLE TAB
81 mg | ORAL | Status: AC
Start: 2017-06-21 — End: 2017-06-21
  Administered 2017-06-21: 18:00:00 via ORAL

## 2017-06-21 MED ORDER — HEPARIN (PORCINE) 5,000 UNIT/ML IJ SOLN
5000 unit/mL | Freq: Three times a day (TID) | INTRAMUSCULAR | Status: DC
Start: 2017-06-21 — End: 2017-06-23
  Administered 2017-06-21 – 2017-06-23 (×5): via SUBCUTANEOUS

## 2017-06-21 MED ORDER — NITROGLYCERIN 0.4 MG SUBLINGUAL TAB
0.4 mg | Freq: Once | SUBLINGUAL | Status: AC
Start: 2017-06-21 — End: 2017-06-21
  Administered 2017-06-21: 19:00:00 via SUBLINGUAL

## 2017-06-21 MED ORDER — LOSARTAN 50 MG TAB
50 mg | Freq: Every day | ORAL | Status: DC
Start: 2017-06-21 — End: 2017-06-23
  Administered 2017-06-22: 22:00:00 via ORAL

## 2017-06-21 MED ORDER — ONDANSETRON (PF) 4 MG/2 ML INJECTION
4 mg/2 mL | INTRAMUSCULAR | Status: DC | PRN
Start: 2017-06-21 — End: 2017-06-23

## 2017-06-21 MED ORDER — CARVEDILOL 25 MG TAB
25 mg | Freq: Two times a day (BID) | ORAL | Status: DC
Start: 2017-06-21 — End: 2017-06-23
  Administered 2017-06-21 – 2017-06-22 (×2): via ORAL

## 2017-06-21 MED ORDER — HYDRALAZINE 50 MG TAB
50 mg | Freq: Three times a day (TID) | ORAL | Status: DC
Start: 2017-06-21 — End: 2017-06-23
  Administered 2017-06-21 – 2017-06-23 (×5): via ORAL

## 2017-06-21 MED ORDER — HYDRALAZINE 50 MG TAB
50 mg | Freq: Three times a day (TID) | ORAL | Status: DC
Start: 2017-06-21 — End: 2017-06-21
  Administered 2017-06-21: 20:00:00 via ORAL

## 2017-06-21 MED ORDER — ASPIRIN 81 MG TAB, DELAYED RELEASE
81 mg | ORAL | Status: DC
Start: 2017-06-21 — End: 2017-06-23
  Administered 2017-06-21: 22:00:00 via ORAL

## 2017-06-21 MED FILL — CARVEDILOL 25 MG TAB: 25 mg | ORAL | Qty: 1

## 2017-06-21 MED FILL — ASPIRIN 81 MG CHEWABLE TAB: 81 mg | ORAL | Qty: 4

## 2017-06-21 MED FILL — ASPIRIN 81 MG TAB, DELAYED RELEASE: 81 mg | ORAL | Qty: 1

## 2017-06-21 MED FILL — CALCIUM ACETATE 667 MG CAP: 667 mg | ORAL | Qty: 2

## 2017-06-21 MED FILL — NITROSTAT 0.4 MG SUBLINGUAL TABLET: 0.4 mg | SUBLINGUAL | Qty: 1

## 2017-06-21 MED FILL — HEPARIN (PORCINE) 5,000 UNIT/ML IJ SOLN: 5000 unit/mL | INTRAMUSCULAR | Qty: 1

## 2017-06-21 MED FILL — HYDRALAZINE 50 MG TAB: 50 mg | ORAL | Qty: 2

## 2017-06-21 NOTE — Progress Notes (Signed)
Admitted with Chest pain ,no SOB, troponin non significant, will dialyze tomorrow  & Wednesday, doubt any acute Pericarditis agree with Echocardiogram..

## 2017-06-21 NOTE — ED Triage Notes (Signed)
Patient reports intermittent chest pain for 2 days with numbness and tingling to right leg and arm

## 2017-06-21 NOTE — ED Provider Notes (Signed)
EMERGENCY DEPARTMENT HISTORY AND PHYSICAL EXAM    1:13 PM      Date: 06/21/2017  Patient Name: William Ruiz    History of Presenting Illness     Chief Complaint   Patient presents with   ??? Chest Pain     with fall         History Provided By: Patient, Patient's Wife    Chief Complaint: Sharp chest pain  Duration:  Hours  Timing:  Acute, intermittent  Location: Central chest  Quality: Sharp  Severity: Moderate  Modifying Factors: None  Associated Symptoms: SOB, syncope, and tingling in the right arm      Additional History (Context): William Ruiz is a 51 y.o. male with a hx of ESRD on HD and an MI who presents with complaints of intemittent sharp chest pain that started yesterday around 1600. His wife also reports two syncopal episodes, one yesterday and one this morning. She states that the pt stood up when he developed sharp chest pain and then fell back into the seat. The pt reports associated SOB that is worsened on exertion and tingling that spreads into his right arm during episodes of chest pain. He dialyzes Monday, Wednesday, and Friday and has not missed a session. He is concerned because he feels like he did during his last heart attack. His nephrologist is Dr. Jannette Fogo, his cardiologist was supposed to be Dr. Lyndel Safe but he has been unable to see him due to insurance issues. He has not urinated in 35 years. He denies fever, cough, HA, and any further complaints.     Ethelda Chick, MD    Current Outpatient Prescriptions   Medication Sig Dispense Refill   ??? atorvastatin (LIPITOR) 40 mg tablet Take 1 Tab by mouth nightly. 30 Tab 0   ??? docusate sodium (COLACE) 100 mg capsule Take 1 Cap by mouth two (2) times daily as needed for Constipation. 30 Cap 0   ??? hydrALAZINE (APRESOLINE) 100 mg tablet Take 1 Tab by mouth three (3) times daily. 90 Tab 0   ??? ipratropium-albuterol (COMBIVENT RESPIMAT) 20-100 mcg/actuation inhaler Take 1 Puff by inhalation every six (6) hours as needed for Wheezing. 1 Inhaler 0    ??? isosorbide mononitrate ER (IMDUR) 30 mg tablet Take 1 Tab by mouth daily. 30 Tab 0   ??? losartan (COZAAR) 50 mg tablet Take 1 Tab by mouth daily. 30 Tab 0   ??? carvedilol (COREG) 25 mg tablet Take 25 mg by mouth two (2) times a day.     ??? calcium acetate (PHOSLO) 667 mg tab tablet Take 1,334 mg by mouth three (3) times daily (with meals).     ??? cinacalcet (SENSIPAR) 60 mg tab Take 60 mg by mouth.     ??? amLODIPine (NORVASC) 10 mg tablet Take 10 mg by mouth daily.     ??? b complex-vitamin c-folic acid (NEPHROCAPS) 1 mg capsule Take 1 Cap by mouth daily.     ??? aspirin delayed-release 81 mg tablet Take 81 mg by mouth every other day.         Past History     Past Medical History:  Past Medical History:   Diagnosis Date   ??? Benign essential HTN    ??? ESRD (end stage renal disease) on dialysis ([facility])    ??? HLD (hyperlipidemia)        Past Surgical History:  Past Surgical History:   Procedure Laterality Date   ??? HX ARTERIOVENOUS FISTULA  Family History:  History reviewed. No pertinent family history.    Social History:  Social History   Substance Use Topics   ??? Smoking status: Never Smoker   ??? Smokeless tobacco: Never Used   ??? Alcohol use No       Allergies:  Allergies   Allergen Reactions   ??? Penicillins Anaphylaxis   ??? Levofloxacin Other (comments)   ??? Vicodin [Hydrocodone-Acetaminophen] Other (comments)         Review of Systems     Review of Systems   Constitutional: Negative.    HENT: Negative.    Eyes: Negative.    Respiratory: Positive for shortness of breath. Negative for cough.    Cardiovascular: Positive for chest pain. Negative for leg swelling.   Gastrointestinal: Negative.    Endocrine: Negative.    Genitourinary: Negative.    Musculoskeletal: Negative.    Skin: Negative.    Allergic/Immunologic: Negative.    Neurological: Positive for syncope. Negative for weakness and headaches.        Positive for right arm tingling   Hematological: Negative.    Psychiatric/Behavioral: Negative.     All other systems reviewed and are negative.        Physical Exam     Patient Vitals for the past 12 hrs:   Temp Pulse Resp BP SpO2   06/21/17 1432 - - - - 97 %   06/21/17 1431 - - - - 98 %   06/21/17 1430 - - - 167/80 96 %   06/21/17 1339 98.2 ??F (36.8 ??C) - 12 - 98 %   06/21/17 1321 - 73 9 - 98 %       Physical Exam   Constitutional: He is oriented to person, place, and time. He appears well-developed.   HENT:   Head: Normocephalic and atraumatic.   Eyes: Conjunctivae and EOM are normal.   Neck: Normal range of motion.   Cardiovascular: Normal heart sounds.  Exam reveals no gallop and no friction rub.    No murmur heard.  Pulmonary/Chest: Effort normal and breath sounds normal. No stridor.   Abdominal: Soft. There is no tenderness.   Musculoskeletal: Normal range of motion. He exhibits no tenderness.   Right arm AV fistula    Neurological: He is alert and oriented to person, place, and time.   Skin: Skin is warm and dry. He is not diaphoretic.   Psychiatric: He has a normal mood and affect. His behavior is normal.   Nursing note and vitals reviewed.        Diagnostic Study Results     Labs -  Recent Results (from the past 12 hour(s))   EKG, 12 LEAD, INITIAL    Collection Time: 06/21/17  1:11 PM   Result Value Ref Range    Ventricular Rate 75 BPM    Atrial Rate 75 BPM    P-R Interval 166 ms    QRS Duration 78 ms    Q-T Interval 432 ms    QTC Calculation (Bezet) 482 ms    Calculated P Axis 53 degrees    Calculated R Axis -5 degrees    Calculated T Axis -21 degrees    Diagnosis       Normal sinus rhythm  Possible Left atrial enlargement  Left ventricular hypertrophy  Nonspecific ST abnormality  Prolonged QT  Abnormal ECG  When compared with ECG of 21-Mar-2017 11:43,  T wave inversion now evident in Inferior leads  T wave amplitude has decreased in Lateral  leads     CARDIAC PANEL,(CK, CKMB & TROPONIN)    Collection Time: 06/21/17  1:19 PM   Result Value Ref Range    CK 134 39 - 308 U/L    CK - MB <1.0 <3.6 ng/ml     CK-MB Index  0.0 - 4.0 %     CALCULATION NOT PERFORMED WHEN RESULT IS BELOW LINEAR LIMIT    Troponin-I, Qt. 0.03 0.0 - 0.045 NG/ML   CBC WITH AUTOMATED DIFF    Collection Time: 06/21/17  1:19 PM   Result Value Ref Range    WBC 5.3 4.6 - 13.2 K/uL    RBC 3.90 (L) 4.70 - 5.50 M/uL    HGB 12.9 (L) 13.0 - 16.0 g/dL    HCT 37.8 36.0 - 48.0 %    MCV 96.9 74.0 - 97.0 FL    MCH 33.1 24.0 - 34.0 PG    MCHC 34.1 31.0 - 37.0 g/dL    RDW 12.8 11.6 - 14.5 %    PLATELET 113 (L) 135 - 420 K/uL    MPV 10.4 9.2 - 11.8 FL    NEUTROPHILS 53 40 - 73 %    LYMPHOCYTES 30 21 - 52 %    MONOCYTES 9 3 - 10 %    EOSINOPHILS 8 (H) 0 - 5 %    BASOPHILS 0 0 - 2 %    ABS. NEUTROPHILS 2.8 1.8 - 8.0 K/UL    ABS. LYMPHOCYTES 1.6 0.9 - 3.6 K/UL    ABS. MONOCYTES 0.5 0.05 - 1.2 K/UL    ABS. EOSINOPHILS 0.4 0.0 - 0.4 K/UL    ABS. BASOPHILS 0.0 0.0 - 0.06 K/UL    DF AUTOMATED     MAGNESIUM    Collection Time: 06/21/17  1:19 PM   Result Value Ref Range    Magnesium 2.5 1.6 - 2.6 mg/dL   METABOLIC PANEL, COMPREHENSIVE    Collection Time: 06/21/17  1:19 PM   Result Value Ref Range    Sodium 137 136 - 145 mmol/L    Potassium 4.6 3.5 - 5.5 mmol/L    Chloride 98 (L) 100 - 108 mmol/L    CO2 26 21 - 32 mmol/L    Anion gap 13 3.0 - 18 mmol/L    Glucose 137 (H) 74 - 99 mg/dL    BUN 66 (H) 7.0 - 18 MG/DL    Creatinine 17.40 (H) 0.6 - 1.3 MG/DL    BUN/Creatinine ratio 4 (L) 12 - 20      GFR est AA 4 (L) >60 ml/min/1.91m    GFR est non-AA 3 (L) >60 ml/min/1.768m   Calcium 9.2 8.5 - 10.1 MG/DL    Bilirubin, total 0.4 0.2 - 1.0 MG/DL    ALT (SGPT) 112 (H) 16 - 61 U/L    AST (SGOT) 59 (H) 15 - 37 U/L    Alk. phosphatase 94 45 - 117 U/L    Protein, total 7.9 6.4 - 8.2 g/dL    Albumin 3.3 (L) 3.4 - 5.0 g/dL    Globulin 4.6 (H) 2.0 - 4.0 g/dL    A-G Ratio 0.7 (L) 0.8 - 1.7     NT-PRO BNP    Collection Time: 06/21/17  1:19 PM   Result Value Ref Range    NT pro-BNP 42018 (H) 0 - 900 PG/ML   PHOSPHORUS    Collection Time: 06/21/17  1:19 PM   Result Value Ref Range     Phosphorus 4.0 2.5 - 4.9  MG/DL       Radiologic Studies -   XR CHEST PORT   Final Result   IMPRESSION:    Negative chest.         Medical Decision Making     Provider Notes (Medical Decision Making): Based on my history, physical exam, and diagnostic evaluation, the patient presents to the emergency department with left sided chest pain suggestive of a coronary syndrome. The pt has cardiac risk factors of known CAD, HTN, ESRD, and hyperlipidemia Their physical exam was unremarkable including normal chest and respiratory exam. The diagnostic evaluation including laboratory data and x-ray were non-diagnostic, but he does have NEW TWI inferiorly without any trop leak. Based on this evaluation, I do believe the symptoms are concerning for acute coronary syndrome, esp given the pain that radiates to his right arm, exertional component, and relief with nitro. I discussed the case with the hospitalist, cardiology, and nephro.     I am the first provider for this patient.    I reviewed the vital signs, available nursing notes, past medical history, past surgical history, family history and social history.    Vital Signs-Reviewed the patient's vital signs.    Pulse Oximetry Analysis -  98% on room air (Interpretation)WNL    Cardiac Monitor:  Rate: 73 BPM  Rhythm:  Normal Sinus Rhythm     EKG:  Interpreted by the EP.   Time Interpreted: 1311   Rate: 75 BPM   Rhythm: Normal Sinus Rhythm    Interpretation:LVH, TWI in III and aVF, QTC of 482   Comparison: New TWI in III and aVF from three months ago    Records Reviewed: Nursing Notes and Old Medical Records (Time of Review: 1:13 PM)    ED Course: Progress Notes, Reevaluation, and Consults:  Consult:  Discussed care with [facility], Cardiology PA  Standard discussion; including history of patient???s chief complaint, available diagnostic results, and treatment course. She is aware of the patient.  2:23 PM, 06/21/17      Consult:  Discussed care with Dr. Jannette Fogo, Specialty: Nephrology  Standard discussion; including history of patient???s chief complaint, available diagnostic results, and treatment course. He will likely dialyze the patient tomorrow.  2:27 PM, 06/21/17     Consult:  Discussed care with Dr. Delene Loll, Specialty: Hospitalist  Standard discussion; including history of patient???s chief complaint, available diagnostic results, and treatment course. He agrees on admission  3:02 PM, 06/21/17       For Hospitalized Patients:    1. Hospitalization Decision Time:  The decision to hospitalize the patient was made by Dr. Edison Simon at 2:22PM on 06/21/2017    2. Aspirin: Aspirin was given    Diagnosis     Clinical Impression:   1. Chest pain, unspecified type    2. ESRD (end stage renal disease) ([place])        Disposition: Admit    _______________________________    Attestations:  Scribe [place] acting as a scribe for and in the presence of Marilynn Latino, MD      June 21, 2017 at 3:05 PM       Provider Attestation:      I personally performed the services described in the documentation, reviewed the documentation, as recorded by the scribe in my presence, and it accurately and completely records my words and actions. June 21, 2017 at 3:05 PM - Marilynn Latino, MD    _______________________________

## 2017-06-21 NOTE — Other (Signed)
TRANSFER - OUT REPORT:    Verbal report given to Keri on William Ruiz  being transferred to [address] south for routine progression of care       Report consisted of patient???s Situation, Background, Assessment and   Recommendations(SBAR).     Information from the following report(s) ED Summary was reviewed with the receiving nurse.    Lines:   Peripheral IV 06/21/17 Left;Upper Arm (Active)   Site Assessment Clean, dry, & intact 06/21/2017  1:22 PM   Phlebitis Assessment 0 06/21/2017  1:22 PM   Infiltration Assessment 0 06/21/2017  1:22 PM   Dressing Status Clean, dry, & intact 06/21/2017  1:22 PM   Dressing Type Tape;Transparent 06/21/2017  1:22 PM   Hub Color/Line Status Pink;Flushed;Patent 06/21/2017  1:22 PM   Action Taken Blood drawn 06/21/2017  1:22 PM        Opportunity for questions and clarification was provided.      Patient transported with:

## 2017-06-21 NOTE — ED Notes (Signed)
[phone] home #  [phone] cell# Elita BooneBernadette Pierce. Clearence CheekFriend

## 2017-06-21 NOTE — ED Notes (Signed)
Patient transported to floor by transporter.

## 2017-06-21 NOTE — H&P (Addendum)
History and Physical      NAME:  William Ruiz   DOB:   [birth year]   MRN:   [id number]     Date/Time:  06/21/2017     CHIEF COMPLAINT: chest pain     HISTORY OF PRESENT ILLNESS:     William Ruiz is a 51 y.o.   male with a PMH of ESRD on HD on MWF, HTN, hyperlipidemia and MI who presents with c/c of chest pain, located on retrosternal area, described as heaviness, radiating to his arm with numbness. It is associated with shortness of breathing. He denies diaphoresis, palpitation, nausea and vomiting. He also has syncopal episode at home according to his family. He denies dizziness or palpitation. Here in ED troponin was 0.03, cardiology has been consulted and admitted for further evaluation and management.      Past Medical History:   Diagnosis Date   ??? Benign essential HTN    ??? ESRD (end stage renal disease) on dialysis ([place])    ??? HLD (hyperlipidemia)         Past Surgical History:   Procedure Laterality Date   ??? HX ARTERIOVENOUS FISTULA         Social History   Substance Use Topics   ??? Smoking status: Never Smoker   ??? Smokeless tobacco: Never Used   ??? Alcohol use No        History reviewed. No pertinent family history.     Allergies   Allergen Reactions   ??? Penicillins Anaphylaxis   ??? Levofloxacin Other (comments)   ??? Vicodin [Hydrocodone-Acetaminophen] Other (comments)        Prior to Admission medications    Medication Sig Start Date End Date Taking? Authorizing Provider   atorvastatin (LIPITOR) 40 mg tablet Take 1 Tab by mouth nightly. 03/23/17  Yes Olushola Ilogho, PA-C   docusate sodium (COLACE) 100 mg capsule Take 1 Cap by mouth two (2) times daily as needed for Constipation. 03/23/17  Yes Olushola Ilogho, PA-C   hydrALAZINE (APRESOLINE) 100 mg tablet Take 1 Tab by mouth three (3) times daily. 03/23/17  Yes Olushola Ilogho, PA-C   ipratropium-albuterol (COMBIVENT RESPIMAT) 20-100 mcg/actuation inhaler Take 1 Puff by inhalation every six (6) hours as needed for Wheezing.  03/23/17  Yes Olushola Ilogho, PA-C   isosorbide mononitrate ER (IMDUR) 30 mg tablet Take 1 Tab by mouth daily. 03/24/17  Yes Olushola Ilogho, PA-C   losartan (COZAAR) 50 mg tablet Take 1 Tab by mouth daily. 03/24/17  Yes Olushola Ilogho, PA-C   carvedilol (COREG) 25 mg tablet Take 25 mg by mouth two (2) times a day.   Yes Phys Other, MD   calcium acetate (PHOSLO) 667 mg tab tablet Take 1,334 mg by mouth three (3) times daily (with meals).   Yes Phys Other, MD   cinacalcet (SENSIPAR) 60 mg tab Take 60 mg by mouth.   Yes Phys Other, MD   amLODIPine (NORVASC) 10 mg tablet Take 10 mg by mouth daily.   Yes Phys Other, MD   b complex-vitamin c-folic acid (NEPHROCAPS) 1 mg capsule Take 1 Cap by mouth daily.   Yes Phys Other, MD   aspirin delayed-release 81 mg tablet Take 81 mg by mouth every other day.   Yes Phys Other, MD       REVIEW OF SYSTEMS:     CONSTITUTIONAL: No Fever, No chills, No weight loss, No Night sweats  HEENT:  No epistaxis, No diff in swallowing  CVS: has chest pain, No  palpitations,  No peripheral edema, No PND, No orthopnea  RS: No shortness of breath, No cough, No hemoptysis, No pleuritic chest pain  GI: No abd pain, No vomitting, No diarrhea, No hematemesis, No rectal bleeding, No acid reflux or heartburn  NEURO: No focal weakness, No headaches, No seizures  PSYCH: No anxiety, No depression  MUSCULOSKLETAL: No joint pain or swelling  GU: No hematuria or dysuria  SKIN: No rash      Physical Exam:    VITALS:    Vital signs reviewed; most recent are:    Visit Vitals   ??? BP 187/63 (BP 1 Location: Right arm, BP Patient Position: At rest)   ??? Pulse 61   ??? Temp 97.3 ??F (36.3 ??C)   ??? Resp 20   ??? Ht 5' 7"  (1.702 m)   ??? Wt 74.8 kg (165 lb)   ??? SpO2 97%   ??? BMI 25.84 kg/m2     SpO2 Readings from Last 6 Encounters:   06/21/17 97%   05/16/17 97%   03/23/17 97%          Intake/Output Summary (Last 24 hours) at 06/21/17 2032  Last data filed at 06/21/17 1822   Gross per 24 hour   Intake              480 ml    Output                0 ml   Net              480 ml          GENERAL: Not in acute distress  HEENT: pink conjunctiva, un icteric sclera,   NECK: No lymphadenopthy or thyroid swelling, JVD not seen  LYMPH: No supraclavicular or cervical or axillary nodes on both sides  CVS: S1S2, No murmurs, No gallop or rub  RS: CTA, No wheezing or crackles  Abd: Soft, non tender, not distended, No guarding, No rigidity  NEURO:  No focal neurologic deficits   Extrm: no leg edema or swelling   Skin: No rash      Labs:  Recent Results (from the past 24 hour(s))   EKG, 12 LEAD, INITIAL    Collection Time: 06/21/17  1:11 PM   Result Value Ref Range    Ventricular Rate 75 BPM    Atrial Rate 75 BPM    P-R Interval 166 ms    QRS Duration 78 ms    Q-T Interval 432 ms    QTC Calculation (Bezet) 482 ms    Calculated P Axis 53 degrees    Calculated R Axis -5 degrees    Calculated T Axis -21 degrees    Diagnosis       Normal sinus rhythm  Possible Left atrial enlargement  Left ventricular hypertrophy  Nonspecific ST abnormality  Prolonged QT  Abnormal ECG  When compared with ECG of 21-Mar-2017 11:43,  T wave inversion now evident in Inferior leads  T wave amplitude has decreased in Lateral leads  Confirmed by Boyd Kerbs, Jun MD, --- ([id number]) on 06/21/2017 4:06:14 PM     CARDIAC PANEL,(CK, CKMB & TROPONIN)    Collection Time: 06/21/17  1:19 PM   Result Value Ref Range    CK 134 39 - 308 U/L    CK - MB <1.0 <3.6 ng/ml    CK-MB Index  0.0 - 4.0 %     CALCULATION NOT PERFORMED WHEN RESULT IS BELOW LINEAR LIMIT    Troponin-I,  Qt. 0.03 0.0 - 0.045 NG/ML   CBC WITH AUTOMATED DIFF    Collection Time: 06/21/17  1:19 PM   Result Value Ref Range    WBC 5.3 4.6 - 13.2 K/uL    RBC 3.90 (L) 4.70 - 5.50 M/uL    HGB 12.9 (L) 13.0 - 16.0 g/dL    HCT 37.8 36.0 - 48.0 %    MCV 96.9 74.0 - 97.0 FL    MCH 33.1 24.0 - 34.0 PG    MCHC 34.1 31.0 - 37.0 g/dL    RDW 12.8 11.6 - 14.5 %    PLATELET 113 (L) 135 - 420 K/uL    MPV 10.4 9.2 - 11.8 FL    NEUTROPHILS 53 40 - 73 %     LYMPHOCYTES 30 21 - 52 %    MONOCYTES 9 3 - 10 %    EOSINOPHILS 8 (H) 0 - 5 %    BASOPHILS 0 0 - 2 %    ABS. NEUTROPHILS 2.8 1.8 - 8.0 K/UL    ABS. LYMPHOCYTES 1.6 0.9 - 3.6 K/UL    ABS. MONOCYTES 0.5 0.05 - 1.2 K/UL    ABS. EOSINOPHILS 0.4 0.0 - 0.4 K/UL    ABS. BASOPHILS 0.0 0.0 - 0.06 K/UL    DF AUTOMATED     MAGNESIUM    Collection Time: 06/21/17  1:19 PM   Result Value Ref Range    Magnesium 2.5 1.6 - 2.6 mg/dL   METABOLIC PANEL, COMPREHENSIVE    Collection Time: 06/21/17  1:19 PM   Result Value Ref Range    Sodium 137 136 - 145 mmol/L    Potassium 4.6 3.5 - 5.5 mmol/L    Chloride 98 (L) 100 - 108 mmol/L    CO2 26 21 - 32 mmol/L    Anion gap 13 3.0 - 18 mmol/L    Glucose 137 (H) 74 - 99 mg/dL    BUN 66 (H) 7.0 - 18 MG/DL    Creatinine 17.40 (H) 0.6 - 1.3 MG/DL    BUN/Creatinine ratio 4 (L) 12 - 20      GFR est AA 4 (L) >60 ml/min/1.63m    GFR est non-AA 3 (L) >60 ml/min/1.756m   Calcium 9.2 8.5 - 10.1 MG/DL    Bilirubin, total 0.4 0.2 - 1.0 MG/DL    ALT (SGPT) 112 (H) 16 - 61 U/L    AST (SGOT) 59 (H) 15 - 37 U/L    Alk. phosphatase 94 45 - 117 U/L    Protein, total 7.9 6.4 - 8.2 g/dL    Albumin 3.3 (L) 3.4 - 5.0 g/dL    Globulin 4.6 (H) 2.0 - 4.0 g/dL    A-G Ratio 0.7 (L) 0.8 - 1.7     NT-PRO BNP    Collection Time: 06/21/17  1:19 PM   Result Value Ref Range    NT pro-BNP 42018 (H) 0 - 900 PG/ML   PHOSPHORUS    Collection Time: 06/21/17  1:19 PM   Result Value Ref Range    Phosphorus 4.0 2.5 - 4.9 MG/DL         Active Problems:    Chest pain (06/21/2017)        Assessment:       1. Chest pain   2. ESRD on HD on MWF,   3. HTN, controlled  4. Hyperlipidemia   5. H/o MI      Plan:       ?? Patient admitted to  telemetry unit  ?? Will do serial cardiac enzymes and EKG  ?? Cardiology already is consulted  ?? Continue ASA, statin, BB and nitrate  ?? Will keep NPO after mid night  ?? If enzyme remains negative will do NST, if positive cath per cardiology  ?? Echo to assess LV function  ?? Nephrology also consulted   ?? Monitor volume status and electrolytes  ?? Continue dialysis per renal.    Total time:  65 minutes             _______________________________________________________________________        Attending Physician: Stanton Kidney, MD       Copies: Ethelda Chick, MD

## 2017-06-21 NOTE — ED Notes (Signed)
Per MD Loel DubonnetFan, patient is able to transport without RN or monitor.

## 2017-06-21 NOTE — Other (Signed)
Bedside shift change report given to Zaneta, RN (oncoming nurse) by KAri, RN (offgoing nurse). Report included the following information SBAR, Kardex and Recent Results.

## 2017-06-21 NOTE — ED Notes (Signed)
Patient patient brought back and placed in room without triage assessment or notification of primary RN. Assuming care at this time.
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
## 2017-06-22 ENCOUNTER — Inpatient Hospital Stay: Admit: 2017-06-22 | Payer: MEDICAID | Primary: Family Medicine

## 2017-06-22 ENCOUNTER — Inpatient Hospital Stay: Payer: MEDICAID | Primary: Family Medicine

## 2017-06-22 LAB — HEP B SURFACE AB
Hep B surface Ab Interp.: POSITIVE
Hepatitis B surface Ab: 27.84 m[IU]/mL (ref >10.0)

## 2017-06-22 LAB — NUCLEAR CARDIAC STRESS TEST
Baseline HR: 68 {beats}/min
Nuc Rest EF: 46 %
Recovery Stage 1 Duration: 1 min:sec
Recovery Stage 1 HR: 87 {beats}/min
Stress Diastolic BP: 96 mmHg
Stress Estimated Workload: 1 METS
Stress Peak HR: 87 {beats}/min
Stress Rate Pressure Product: 17922 BPM*mmHg
Stress ST Depression: 0 mm
Stress ST Elevation: 0 mm
Stress Stage 1 Duration: 1 min:sec
Stress Stage 1 HR: 68 {beats}/min
Stress Stage 2 Duration: 1 min:sec
Stress Stage 2 HR: 72 {beats}/min
Stress Stage 3 Duration: 1 min:sec
Stress Stage 3 HR: 84 {beats}/min
Stress Systolic BP: 206 mmHg
Stress Target HR: 143 {beats}/min
TID: 1.04

## 2017-06-22 LAB — METABOLIC PANEL, COMPREHENSIVE
A-G Ratio: 0.8 (ref 0.8–1.7)
ALT (SGPT): 99 U/L — ABNORMAL HIGH (ref 16–61)
AST (SGOT): 56 U/L — ABNORMAL HIGH (ref 15–37)
Albumin: 3.2 g/dL — ABNORMAL LOW (ref 3.4–5.0)
Alk. phosphatase: 108 U/L (ref 45–117)
Anion gap: 13 mmol/L (ref 3.0–18)
BUN/Creatinine ratio: 4 — ABNORMAL LOW (ref 12–20)
BUN: 82 MG/DL — ABNORMAL HIGH (ref 7.0–18)
Bilirubin, total: 0.5 MG/DL (ref 0.2–1.0)
CO2: 25 mmol/L (ref 21–32)
Calcium: 9.1 MG/DL (ref 8.5–10.1)
Chloride: 99 mmol/L — ABNORMAL LOW (ref 100–108)
Creatinine: 19.2 MG/DL — ABNORMAL HIGH (ref 0.6–1.3)
GFR est AA: 3 mL/min/{1.73_m2} — ABNORMAL LOW (ref >60)
GFR est non-AA: 3 mL/min/{1.73_m2} — ABNORMAL LOW (ref >60)
Globulin: 4.2 g/dL — ABNORMAL HIGH (ref 2.0–4.0)
Glucose: 112 mg/dL — ABNORMAL HIGH (ref 74–99)
Potassium: 5.1 mmol/L (ref 3.5–5.5)
Protein, total: 7.4 g/dL (ref 6.4–8.2)
Sodium: 137 mmol/L (ref 136–145)

## 2017-06-22 LAB — CBC WITH AUTOMATED DIFF
ABS. BASOPHILS: 0 10*3/uL (ref 0.0–0.06)
ABS. EOSINOPHILS: 0.5 10*3/uL — ABNORMAL HIGH (ref 0.0–0.4)
ABS. LYMPHOCYTES: 1.5 10*3/uL (ref 0.9–3.6)
ABS. MONOCYTES: 0.7 10*3/uL (ref 0.05–1.2)
ABS. NEUTROPHILS: 2.2 10*3/uL (ref 1.8–8.0)
BASOPHILS: 0 % (ref 0–2)
EOSINOPHILS: 10 % — ABNORMAL HIGH (ref 0–5)
HCT: 37.3 % (ref 36.0–48.0)
HGB: 12.6 g/dL — ABNORMAL LOW (ref 13.0–16.0)
LYMPHOCYTES: 31 % (ref 21–52)
MCH: 33.2 PG (ref 24.0–34.0)
MCHC: 33.8 g/dL (ref 31.0–37.0)
MCV: 98.2 FL — ABNORMAL HIGH (ref 74.0–97.0)
MONOCYTES: 14 % — ABNORMAL HIGH (ref 3–10)
MPV: 11 FL (ref 9.2–11.8)
NEUTROPHILS: 45 % (ref 40–73)
PLATELET: 98 10*3/uL — ABNORMAL LOW (ref 135–420)
RBC: 3.8 M/uL — ABNORMAL LOW (ref 4.70–5.50)
RDW: 12.9 % (ref 11.6–14.5)
WBC: 4.9 10*3/uL (ref 4.6–13.2)

## 2017-06-22 LAB — HEP B SURFACE AG
Hep B surface Ag Interp.: NEGATIVE
Hepatitis B surface Ag: <0.1 Index (ref <1.00)

## 2017-06-22 LAB — PTH INTACT
Calcium: 8.3 MG/DL — ABNORMAL LOW (ref 8.5–10.1)
PTH, Intact: 390.5 pg/mL — ABNORMAL HIGH (ref 18.4–88.0)

## 2017-06-22 LAB — ECHO ADULT FOLLOW-UP OR LIMITED
EF BP: 55.4 % (ref 55–100)
IVSd: 1.32 cm — AB (ref 0.6–1.0)
LV EDV A2C: 108.7 mL
LV EDV A4C: 119.1 mL
LV EDV BP: 115.8 ml (ref 67–155)
LV EDV Index A2C: 58.8 mL/m2
LV EDV Index A4C: 64.4 mL/m2
LV EDV Index BP: 62.6 mL/m2
LV ESV A2C: 56.1 mL
LV ESV A4C: 47 mL
LV ESV BP: 51.6 mL (ref 22–58)
LV ESV Index A2C: 30.3 mL/m2
LV ESV Index A4C: 25.4 mL/m2
LV ESV Index BP: 27.9 mL/m2
LV Ejection Fraction A2C: 48 %
LV Ejection Fraction A4C: 61 %
LV Mass 2D Index: 201.3 g/m2 — AB
LV Mass 2D: 372.1 g — AB (ref 88–224)
LVIDd: 5.4 cm (ref 4.2–5.9)
LVIDs: 3.65 cm
LVPWd: 1.36 cm — AB (ref 0.6–1.0)

## 2017-06-22 LAB — CARDIAC PANEL,(CK, CKMB & TROPONIN)
CK - MB: 1.1 ng/ml (ref <3.6)
CK-MB Index: 1.1 % (ref 0.0–4.0)
CK: 99 U/L (ref 39–308)
Troponin-I, QT: 0.03 NG/ML (ref 0.0–0.045)

## 2017-06-22 LAB — PHOSPHORUS: Phosphorus: 6 MG/DL — ABNORMAL HIGH (ref 2.5–4.9)

## 2017-06-22 LAB — TRANSTHORACIC ECHOCARDIOGRAM (TTE) LIMITED (CONTRAST/BUBBLE/3D PRN)
EF BP: 55.4 % (ref 55–100)
IVSd: 1.32 cm — AB (ref 0.6–1)
LV EDV A2C: 108.7 mL
LV EDV A4C: 119.1 mL
LV EDV BP: 115.8 ml (ref 67–155)
LV EDV Index A2C: 58.8 mL/m2
LV EDV Index A4C: 64.4 mL/m2
LV EDV Index BP: 62.6 mL/m2
LV ESV A2C: 56.1 mL
LV ESV A4C: 47 mL
LV ESV BP: 51.6 mL (ref 22–58)
LV ESV Index A2C: 30.3 mL/m2
LV ESV Index A4C: 25.4 mL/m2
LV ESV Index BP: 27.9 mL/m2
LV Ejection Fraction A2C: 48 %
LV Ejection Fraction A4C: 61 %
LV Mass 2D Index: 201.3 g/m2 — AB
LV Mass 2D: 372.1 g — AB (ref 88–224)
LVIDd: 5.4 cm (ref 4.2–5.9)
LVIDs: 3.65 cm
LVPWd: 1.36 cm — AB (ref 0.6–1)
Left Ventricular Ejection Fraction: 58

## 2017-06-22 LAB — HEPATITIS B SURFACE ANTIBODY
Hep B S Ab Interp: POSITIVE
Hep B S Ab: 27.84 m[IU]/mL (ref >10.0)

## 2017-06-22 MED ORDER — TECHNETIUM TC 99M SESTAMIBI - CARDIOLITE
Freq: Once | Status: AC
Start: 2017-06-22 — End: 2017-06-22
  Administered 2017-06-22: 13:00:00 via INTRAVENOUS

## 2017-06-22 MED ORDER — REGADENOSON 0.4 MG/5 ML IV SYRINGE
0.4 mg/5 mL | Freq: Once | INTRAVENOUS | Status: AC
Start: 2017-06-22 — End: 2017-06-22
  Administered 2017-06-22: 15:00:00 via INTRAVENOUS

## 2017-06-22 MED ORDER — IOPAMIDOL 76 % IV SOLN
370 mg iodine /mL (76 %) | Freq: Once | INTRAVENOUS | Status: AC
Start: 2017-06-22 — End: 2017-06-23

## 2017-06-22 MED ORDER — TECHNETIUM TC 99M SESTAMIBI - CARDIOLITE
Freq: Once | Status: AC
Start: 2017-06-22 — End: 2017-06-22
  Administered 2017-06-22: 15:00:00 via INTRAVENOUS

## 2017-06-22 MED ORDER — DOXERCALCIFEROL 4 MCG/2 ML IV SOLN
4 mcg/2 mL | INTRAVENOUS | Status: DC
Start: 2017-06-22 — End: 2017-06-23

## 2017-06-22 MED ORDER — ISOSORBIDE MONONITRATE SR 60 MG 24 HR TAB
60 mg | Freq: Every day | ORAL | Status: DC
Start: 2017-06-22 — End: 2017-06-22

## 2017-06-22 MED ORDER — ISOSORBIDE MONONITRATE SR 60 MG 24 HR TAB
60 mg | Freq: Every day | ORAL | Status: DC
Start: 2017-06-22 — End: 2017-06-23
  Administered 2017-06-22: 22:00:00 via ORAL

## 2017-06-22 MED ORDER — MORPHINE 2 MG/ML INJECTION
2 mg/mL | INTRAMUSCULAR | Status: DC | PRN
Start: 2017-06-22 — End: 2017-06-23
  Administered 2017-06-22 – 2017-06-23 (×3): via INTRAVENOUS

## 2017-06-22 MED ORDER — SODIUM CHLORIDE 0.9 % IV
Freq: Once | INTRAVENOUS | Status: AC
Start: 2017-06-22 — End: 2017-06-22

## 2017-06-22 MED ORDER — ACETAMINOPHEN 325 MG TABLET
325 mg | ORAL | Status: DC | PRN
Start: 2017-06-22 — End: 2017-06-23
  Administered 2017-06-22: 18:00:00 via ORAL

## 2017-06-22 MED ORDER — MORPHINE 2 MG/ML INJECTION
2 mg/mL | INTRAMUSCULAR | Status: AC
Start: 2017-06-22 — End: 2017-06-22
  Administered 2017-06-22: 02:00:00

## 2017-06-22 MED FILL — ISOSORBIDE MONONITRATE SR 60 MG 24 HR TAB: 60 mg | ORAL | Qty: 2

## 2017-06-22 MED FILL — HYDRALAZINE 50 MG TAB: 50 mg | ORAL | Qty: 2

## 2017-06-22 MED FILL — HEPARIN (PORCINE) 5,000 UNIT/ML IJ SOLN: 5000 unit/mL | INTRAMUSCULAR | Qty: 1

## 2017-06-22 MED FILL — CALCIUM ACETATE 667 MG CAP: 667 mg | ORAL | Qty: 2

## 2017-06-22 MED FILL — TYLENOL 325 MG TABLET: 325 mg | ORAL | Qty: 2

## 2017-06-22 MED FILL — MORPHINE 2 MG/ML INJECTION: 2 mg/mL | INTRAMUSCULAR | Qty: 1

## 2017-06-22 MED FILL — LEXISCAN 0.4 MG/5 ML INTRAVENOUS SYRINGE: 0.4 mg/5 mL | INTRAVENOUS | Qty: 5

## 2017-06-22 MED FILL — LOSARTAN 50 MG TAB: 50 mg | ORAL | Qty: 1

## 2017-06-22 MED FILL — ATORVASTATIN 40 MG TAB: 40 mg | ORAL | Qty: 1

## 2017-06-22 MED FILL — SODIUM CHLORIDE 0.9 % IV: INTRAVENOUS | Qty: 250

## 2017-06-22 MED FILL — CARVEDILOL 25 MG TAB: 25 mg | ORAL | Qty: 1

## 2017-06-22 MED FILL — AMLODIPINE 10 MG TAB: 10 mg | ORAL | Qty: 1

## 2017-06-22 MED FILL — ISOVUE-370  76 % INTRAVENOUS SOLUTION: 370 mg iodine /mL (76 %) | INTRAVENOUS | Qty: 100

## 2017-06-22 NOTE — Progress Notes (Signed)
[facility] Hospitalist Group  Progress Note    Patient: William Ruiz Age: 51 y.o. DOB: 02/11/1966 MR#: [id number] SSN: [id number]  Date/Time: 06/22/2017    Subjective:   Pt states that his cp has improved but is not completely gone. Evaluated during HD.      Assessment/Plan:   -Chest pain s/p eval by cardiology, s/p cardiac echo and pharm nuc stress test reviewed by cardiology w/ ultimate recommendations to not repeat cardiac cath done within past 12 mos. Recommend improved bp control. Pt also states he's had a blood clot "somewhere" in his body and was supposed to be on coumadin but due to SOB no longer on coumadin. Pt therefore w/ risk factors for PE given cp, syncope also c/o sob and history of blood clots. Discussed w/ nephrologist, okay to do cta chest now as pt will be dialyzed again tomorrow. On bb, daily asa, statin.  -Hypertensive emergency pt w/ elevated bp and cp. meds adjusted by cardiology.  -ESRD inpt HD per nephrologist.  PLAN:  -stat CTA chest  -add clonidine if current regimen of bp meds not effective in controlling bp.    Additional Notes:        Case discussed with:  [x] Patient  [] Family  [] Nursing  [] Case Management  DVT Prophylaxis:  [] Lovenox  [x] Hep SQ  [] SCDs  [] Coumadin   [] On Heparin gtt    Objective:   VS:   Visit Vitals   ??? BP 198/88   ??? Pulse 63   ??? Temp 97.6 ??F (36.4 ??C) (Oral)   ??? Resp 18   ??? Ht 5' 7"  (1.702 m)   ??? Wt 74.8 kg (165 lb)   ??? SpO2 96%   ??? BMI 25.84 kg/m2      Tmax/24hrs: Temp (24hrs), Avg:97.6 ??F (36.4 ??C), Min:97 ??F (36.1 ??C), Max:98.2 ??F (36.8 ??C)    Input/Output:   Intake/Output Summary (Last 24 hours) at 06/22/17 1532  Last data filed at 06/22/17 0419   Gross per 24 hour   Intake              480 ml   Output                0 ml   Net              480 ml       General: alert, awake, in nad   Cardiovascular:  Rrr, no murmurs  Pulmonary:  ctab  GI:  Soft, nt, nd  Extremities:  No edema  Additional:      Labs:     Recent Results (from the past 24 hour(s))   CBC WITH AUTOMATED DIFF    Collection Time: 06/22/17  4:43 AM   Result Value Ref Range    WBC 4.9 4.6 - 13.2 K/uL    RBC 3.80 (L) 4.70 - 5.50 M/uL    HGB 12.6 (L) 13.0 - 16.0 g/dL    HCT 37.3 36.0 - 48.0 %    MCV 98.2 (H) 74.0 - 97.0 FL    MCH 33.2 24.0 - 34.0 PG    MCHC 33.8 31.0 - 37.0 g/dL    RDW 12.9 11.6 - 14.5 %    PLATELET 98 (L) 135 - 420 K/uL    MPV 11.0 9.2 - 11.8 FL    NEUTROPHILS 45 40 - 73 %    LYMPHOCYTES 31 21 - 52 %    MONOCYTES 14 (H) 3 - 10 %  EOSINOPHILS 10 (H) 0 - 5 %    BASOPHILS 0 0 - 2 %    ABS. NEUTROPHILS 2.2 1.8 - 8.0 K/UL    ABS. LYMPHOCYTES 1.5 0.9 - 3.6 K/UL    ABS. MONOCYTES 0.7 0.05 - 1.2 K/UL    ABS. EOSINOPHILS 0.5 (H) 0.0 - 0.4 K/UL    ABS. BASOPHILS 0.0 0.0 - 0.06 K/UL    DF AUTOMATED     METABOLIC PANEL, COMPREHENSIVE    Collection Time: 06/22/17  4:43 AM   Result Value Ref Range    Sodium 137 136 - 145 mmol/L    Potassium 5.1 3.5 - 5.5 mmol/L    Chloride 99 (L) 100 - 108 mmol/L    CO2 25 21 - 32 mmol/L    Anion gap 13 3.0 - 18 mmol/L    Glucose 112 (H) 74 - 99 mg/dL    BUN 82 (H) 7.0 - 18 MG/DL    Creatinine 19.20 (H) 0.6 - 1.3 MG/DL    BUN/Creatinine ratio 4 (L) 12 - 20      GFR est AA 3 (L) >60 ml/min/1.60m    GFR est non-AA 3 (L) >60 ml/min/1.744m   Calcium 9.1 8.5 - 10.1 MG/DL    Bilirubin, total 0.5 0.2 - 1.0 MG/DL    ALT (SGPT) 99 (H) 16 - 61 U/L    AST (SGOT) 56 (H) 15 - 37 U/L    Alk. phosphatase 108 45 - 117 U/L    Protein, total 7.4 6.4 - 8.2 g/dL    Albumin 3.2 (L) 3.4 - 5.0 g/dL    Globulin 4.2 (H) 2.0 - 4.0 g/dL    A-G Ratio 0.8 0.8 - 1.7     PHOSPHORUS    Collection Time: 06/22/17  4:43 AM   Result Value Ref Range    Phosphorus 6.0 (H) 2.5 - 4.9 MG/DL   PTH INTACT    Collection Time: 06/22/17  4:43 AM   Result Value Ref Range    Calcium 8.3 (L) 8.5 - 10.1 MG/DL    PTH, Intact 390.5 (H) 18.4 - 88.0 pg/mL   HEP B SURFACE AB    Collection Time: 06/22/17  4:43 AM   Result Value Ref Range     Hepatitis B surface Ab 27.84 >10.0 mIU/mL    Hep B surface Ab Interp. POSITIVE POS      Hep B surface Ab comment        Samples with a  value of 10 mIU/mL or greater are considered positive (protective immunity) in accordance with the CDC guidelines.   HEP B SURFACE AG    Collection Time: 06/22/17  4:43 AM   Result Value Ref Range    Hepatitis B surface Ag <0.10 <1.00 Index    Hep B surface Ag Interp. NEGATIVE  NEG     CARDIAC PANEL,(CK, CKMB & TROPONIN)    Collection Time: 06/22/17  8:49 AM   Result Value Ref Range    CK 99 39 - 308 U/L    CK - MB 1.1 <3.6 ng/ml    CK-MB Index 1.1 0.0 - 4.0 %    Troponin-I, Qt. 0.03 0.0 - 0.045 NG/ML   ECHO ADULT FOLLOW-UP OR LIMITED    Collection Time: 06/22/17  9:21 AM   Result Value Ref Range    LVIDd 5.40 4.2 - 5.9 cm    LVPWd 1.36 (A) 0.6 - 1.0 cm    LVIDs 3.65 cm    IVSd 1.32 (A)  0.6 - 1.0 cm    LV ED Vol A2C 108.7 mL    LV ES Vol A4C 47.0 mL    LV ES Vol BP 51.6 22 - 58 mL    BP EF 55.4 55 - 100 %    LV Ejection Fraction MOD 4C 61 %    LV Ejection Fraction MOD 2C 48 %    LV Mass AL 372.1 (A) 88 - 224 g    LV Mass AL Index 201.3 (A) g/m2    LV ES Vol A2C 56.1 mL    LVES Vol Index BP 27.9 mL/m2    LV ED Vol A4C 119.1 mL    LVED Vol Index BP 62.6 mL/m2    LV ED Vol BP 115.8 67 - 155 ml    LVED Vol Index A4C 64.4 mL/m2    LVED Vol Index A2C 58.8 mL/m2    LVES Vol Index A4C 25.4 mL/m2    LVES Vol Index A2C 30.3 mL/m2   NUCLEAR CARDIAC STRESS TEST    Collection Time: 06/22/17 11:20 AM   Result Value Ref Range    Target HR 143 bpm    Exercise duration time 00:04:00     Stress Base Systolic BP 323 mmHg    Stress Base Diastolic BP 96 mmHg    Post peak HR 87 BPM    Baseline HR 68 BPM    Estimated workload 1.0 METS    Baseline BP 206/96 mmHg    ST Elevation (mm) 0 mm    ST Depression (mm) 0 mm    Stress Rate Pressure Product 17922 BPM*mmHg    TID 1.04     Nuc Rest EF 46 %    Stress Stage 1 Duration 1 min:sec    Stress Stage 1 HR 68 bpm    Stress Stage 2 Duration 1 min:sec     Stress Stage 2 HR 72 bpm    Stress Stage 2 BP 202/98 mmHg    Stress Stage 3 Duration 1 min:sec    Stress Stage 3 HR 84 bpm    Stress Stage 3 BP 194/93 mmHg    Recovery Stage 1 Duration 1 min:sec    Recovery Stage 1 HR 87 bpm    Recovery Stage 1 BP 195/92 mmHg     Additional Data Reviewed:      Signed By: Kathlen Mody, MD     June 22, 2017 3:32 PM

## 2017-06-22 NOTE — Progress Notes (Signed)
Patient's existing IV does not flush. Patient says it hurts too. Nurse Sherrilyn RistKari notified.
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
## 2017-06-22 NOTE — Consults (Signed)
[facility]  CONSULTATION    EMRY, BARBATO  MR#: [id number]  DOB: [birth year]  ACCOUNT #: [id number]   DATE OF SERVICE: 06/21/2017    Consultation was requested from the emergency room physician.    REASON FOR CONSULTATION:  Dialysis patient came with chest pain.    HISTORY OF PRESENT ILLNESS:  This 51 year old Caucasian male who is under my care since 03/2017, presented to the emergency room complaining of the patient is having chest pain since last Thursday.  He claims that the pain started while running his daily 6-mile run.  He has ESRD secondary to hypertension, and was getting dialysis  in [place].  Recently he moved to [place].  He usually gets dialysis every Monday, Wednesday, Friday, and missed treatment before coming to the emergency room.  He is a  Person who gives different history time to time.  Claims that he had cardiac cath at [facility] in [place] since his move here, but there is no documentation of any record we could find.  Cannot tell as anything exactly which physician has seen.  So far has not fixed any primary care physician, but comes to dialysis unit since he has been moving here.  One time he claimed that he had a car jack and went to some urgency care or hospital.  Not a single documentation available.  Also, by reviewing the record, found that he had a cardiac cath somewhere in [place], but exactly not sure who saw him.  He has a functional fistula in the right arm usually gets dialysis, and also gaining weight.  He is also on Coumadin for reasons not clear to me, and said that he needed that blood thinner.Marland Kitchen  He also said he has memory issue; cannot recall all his problem that he is going through.  He also claimed that he was to get a kidney transplant.  Besides that, he claims that he has a history of trauma, and has broken 6 ribs and developed pneumothorax.  He is supposed to establish  cardiac care with Dr. Chales Abrahams since he has been here, but not done so far.  He has issue with the insurance; still could not fix any insurance coverage, and he gets his medicine by going to some area that allows to get his medicine, so not currently on Medicaid.  His cardiac risk factors include male gender, hypertension, coronary artery disease, ESRD also, and possibly smoker.    PAST MEDICAL HISTORY:  Hypertension, ESRD, hyperlipidemia.    SOCIAL HISTORY:  Single.  Never smoked though, he claims.  No drugs, no alcohol.    FAMILY HISTORY:  Not clear    ALLERGIES:  PENICILLINS, LEVAQUIN, VICODIN.    MEDICATION LIST:  As follows:  Lipitor 40 mg daily, Colace 100 mg daily, hydralazine 100 mg 3 times daily, Combivent inhaler, Imdur 30 mg daily, Cozaar 100 mg p.o. daily, Coreg 25 mg p.o. daily, PhosLo 667 mg 2 tablets 3 times daily, Sensipar 60 mg daily, Norvasc 10 mg p.o. daily.    REVIEW OF SYSTEMS:  GENERAL:  Well-built, well-nourished gentleman without any acute distress, but claims that he is concerned about his heart.  VITAL SIGNS:  Temperature 98.2, heart rate 73, blood pressure 167/80.  HEENT:  Oral mucosa moist.    NECK:  Jugular venous pressure not distended.  Carotid:  No audible bruit.  LUNGS:  Good air entry.  HEART:  S1, S2, without any gallop or murmur.  ABDOMEN:  Soft.  No  palpable mass.  EXTREMITIES:  Ankle negative edema.    RELEVANT LABORATORIES:  On admission, WBC of 5.3 with hemoglobin of 12.9, hematocrit of 37.8, platelets of 118,000.  Today, WBC of 4.9 with hemoglobin of 12.6, hematocrit of 37.3, platelet of 98,000.  Chemistry on admission:  137 sodium, potassium 4.6, chloride 90, CO2 of 26, glucose of 137.  Blood urea nitrogen 66, creatinine of 17.40.  When he was discharged last time from this hospital blood urea was 35, creatinine was 11.90, and definitely when he came initially his blood urea was quite high at 70, and the creatinine was 19.8.  Calcium 9.2, phosphorus 4.2, total protein of  7.9 with albumin of 3.3.  CPK 134, MB fraction less than 1.  Troponin 0.03.  BNP of 42,018.  PTH is done, 390.3 picograms, and in April it was 307.      Chest X-Ray:  Negative chest.  No signs of volume overload.  EKG that was done shows normal sinus rhythm, left ventricular hypertrophy, nonspecific ST-T wave changes, prolonged QT, mild ST segment depression in the inferior leads.  By this time an echocardiogram was done.  [facility] echocardi[facility] in [place], [place]:  Left ventricular ejection fraction previously was 45%-50% with normal wall motion abnormality, now it is left ventricular ejection fraction around 60%.    PHYSICAL EXAMINATION:  VITAL SIGNS:  Temperature 98.2, heart rate 77 per minute, blood pressure 161/82.  HEENT:  Oral mucosa moist.  NECK:  Supple.  Jugular venous pressure not distended.  No audible carotid bruit.  LUNGS:  Good air entry.  CARDIOVASCULAR:  S1, S2.  There is no gallop or murmur.    ABDOMEN:  Soft.  No palpable mass.  EXTREMITIES:  Ankle, negative edema.  Has a good thrill and bruit on his left arm AV fistula.    Labs as I mentioned.    ASSESSMENT AND PLAN:  Chest pain.  Cardiac workup needed.  Cardiology is following.  They plan for a stress test and echocardiogram, and other relevant labs, but his history is quite suspicious, and whether he had a cardiac cath or not is not clear, but definitely his BUN and creatinine is quite high, though he gets dialysis.  He needs aggressive dialysis still, and will need to dialyze him today and tomorrow to make him feel better.  Also, if the cause of this chest pain is not clear, then possibly he will also undergo other workup for the atypical chest pain, but his memory is just not clear, and it is still not clear whether he really needs this anticoagulation or not.  He claims that he may have a deep vein thrombosis, but so far, no such proven things.  Patient will be monitored  definitely.  Will follow cardiologist's recommendations, and he will be dialyzed on regular basis.  His phosphorus needs to be controlled also.      Timothy LassoMOSTA G. Verona Hartshorn, MD       [facility]  D: 06/22/2017 18:56     T: 06/22/2017 21:16  JOB #: [id number]

## 2017-06-22 NOTE — Other (Signed)
ACUTE HEMODIALYSIS FLOW SHEET    HEMODIALYSIS ORDERS: Physician: haque     Dialyzer: revaclear   Duration: 3.75 hr  BFR: 400   DFR: 800   Dialysate:  Temp 37 K+   2    Ca+  2.5 Na 138 Bicarb 35   Weight:  74.8 kg    Pt chart _0      Unable to Obtain _1       Dry weight/UF Goal: 2018m Access RUE hybrid avf/avg  Needle Gauge 15    Heparin _2   Bolus 1200Units    _3  Hourly 500Units    _4 None      Catheter locking solution NA   Pre BP:   194/108    Pulse:     71     Temperature:   97.6  Respirations: 18  Tx: NS       ml/Bolus  Other        _5  [name]   Labs: Pre        Post:        _6  [name]   Additional Orders(medications, blood products, hypotension management):       _7  [name]     _8  [facility] Consent Verified     CATHETER ACCESS: _9 [name]   _10 Right   _11 Left   _12 IJ     _13 Fem   _14  First use X-ray verified     _15 Tunnel                _16  Non Tunneled   _17 No S/S infection  _18 Redness  _19 Drainage _20 Cultured _21 Swelling _22 Pain   _23 Medical Aseptic Prep Utilized   _24 Dressing Changed  _25  Biopatch  Date:       _26 Clotted   _27 Patent   Flows: _28 Good  _29 Poor  _30 Reversed   If access problem, Dr. notified: _31 Yes    _32 [name]  Date:           GRAFT/FISTULA ACCESS:  _33 [name]     _34 Right     _35 Left     _36 UE     _37 LE   _38 AVG   _39 AVF        _40 Buttonhole    _41 Medical Aseptic Prep Utilized   _42 No S/S infection  _43 Redness  _44 Drainage _45 Cultured _46 Swelling _47 Pain    Bruit:   _48  Strong    _49  Weak       Thrill :   _50  Strong    _51  Weak       Needle Gauge: 15   Length: 1   If access problem, Dr. notified: _52 Yes     _53 [name]  Date:        Please describe access if present and not used:       GENERAL ASSESSMENT:    LUNGS:  Rate 18 SaO2%        _54  [name]    _55  Clear  _56  Coarse  _57  Crackles  _58  Wheezing        _59  Diminished     Location : _60 RLL   _61 LLL    _62 RUL  _63 LUL   Cough: _64 Productive  _65 Dry  _66 [name]   Respirations:  _67 Easy  _68 Labored   Therapy:  _69 RA  _70 NC  l/min    Mask: _71 NRB _72 Venti       O2%                  _73 Ventilator  _74 Intubated  _75  Trach  _76  BiPaP    CARDIAC: _77 Regular      _78  Irregular   _79  Pericardial Rub  _80   JVD        _0   Monitored  _1  Bedside  _2  Remotely monitored _3  [name]  Rhythm:    EDEMA: _4  None  _5 Generalized  _6  Pitting _7  1    _8  2    _9  3    _10  4                 _11  Facial  _12  Pedal  _13   UE  _14  LE   SKIN:   _15  Warm  _16  Hot     _17  Cold   _18  Dry     _19  Pale   _20  Diaphoretic                  _21  Flushed  _22  Jaundiced  _23  Cyanotic  _24  Rash  _25  Weeping   LOC:    _26  Alert      _27 Oriented:    _28  Person     _29  Place  _30 Time               _31  Confused  _32  Lethargic  _33  Medicated  _34  Non-responsive     GI / ABDOMEN:  _35  Flat    _36  Distended    _37  Soft    _38  Firm   _39   Obese                             _40  Diarrhea  _41  Bowel Sounds  _42  Nausea  _43  Vomiting      GU / URINE ASSESSMENT:_44  Voiding   _45  Oliguria  _46  Anuria   _47   Foley     _48  Incontinent    _49   Incontinent Brief      _50   Bathroom Privileges     PAIN: _51  0 _52 1  _53 2   _54 3   _55 4   _56 5   _57 6   _58 7   _59 8   _60 9   _61 10            Scale 0-10  Action/Follow Up:    MOBILITY:  _62  Amb    _63  Amb/Assist    _64  Bed    _65  Wheelchair  _66  Stretcher      All Vitals and Treatment Details on Attached [facility]: [facility]          Room # 214     _67  1st Time Acute  _68  Stat  _69  Routine  _70  Urgent     _71  Acute Room  _72   Bedside  _73  ICU/CCU  _74  ER   Isolation Precautions:  _75  Dialysis   _76  Airborne   _77  Contact    _78  Reverse   Special Considerations:         _79  Blood Consent Verified _80 [name]     ALLERGIES:   _81    Allergies   Penicillins Anaphylaxis High  03/21/2017  Past Updates...   Levofloxacin Other (comments)   03/21/2017  Past Updates...   Vicodin [Hydrocodone-acetaminophen]                Code Status:  _82  Full Code  _83  DNR  _84  Other           HBsAg ONLY: Date Drawn 06/22/2017         _85 Negative _86 Positive _87 Unknown   HBsAb: Date 06/22/2017    _88  Susceptible   _89  Immune10 _90 Not Drawn  _91  Drawn     Current Labs:  Date of Labs:           Today _0       Results for DERIC, BOCOCK (MRN [id number]) as of 06/22/2017 15:51   Ref. Range 06/22/2017 04:43   WBC Latest Ref Range: 4.6 - 13.2 K/uL 4.9   RBC Latest Ref Range: 4.70 - 5.50 M/uL 3.80 (L)   HGB Latest Ref Range: 13.0 - 16.0 g/dL 12.6 (L)   HCT Latest Ref Range: 36.0 - 48.0 % 37.3   MCV Latest Ref Range: 74.0 - 97.0 FL 98.2 (H)   MCH Latest Ref Range: 24.0 - 34.0 PG 33.2   MCHC Latest Ref Range: 31.0 - 37.0 g/dL 33.8   RDW Latest Ref Range: 11.6 - 14.5 % 12.9   PLATELET Latest Ref Range: 135 - 420 K/uL 98 (L)   MPV Latest Ref Range: 9.2 - 11.8 FL 11.0   NEUTROPHILS Latest Ref Range: 40 - 73 % 45   LYMPHOCYTES Latest Ref Range: 21 - 52 % 31   MONOCYTES Latest Ref Range: 3 - 10 % 14 (H)   EOSINOPHILS Latest Ref Range: 0 - 5 % 10 (H)   BASOPHILS Latest Ref Range: 0 - 2 % 0   DF Latest Units:   AUTOMATED   ABS. NEUTROPHILS Latest Ref Range: 1.8 - 8.0 K/UL 2.2   ABS. LYMPHOCYTES Latest Ref Range: 0.9 - 3.6 K/UL 1.5   ABS. MONOCYTES Latest Ref Range: 0.05 - 1.2 K/UL 0.7   ABS. EOSINOPHILS Latest Ref Range: 0.0 - 0.4 K/UL 0.5 (H)   ABS. BASOPHILS Latest Ref Range: 0.0 - 0.06 K/UL 0.0   Sodium Latest Ref Range: 136 - 145 mmol/L 137   Potassium Latest Ref Range: 3.5 - 5.5 mmol/L 5.1   Chloride Latest Ref Range: 100 - 108 mmol/L 99 (L)   CO2 Latest Ref Range: 21 - 32 mmol/L 25   Anion gap Latest Ref Range: 3.0 - 18 mmol/L 13   Glucose Latest Ref Range: 74 - 99 mg/dL 112 (H)   BUN Latest Ref Range: 7.0 - 18 MG/DL 82 (H)   Creatinine Latest Ref Range: 0.6 - 1.3 MG/DL 19.20 (H)   BUN/Creatinine ratio Latest Ref Range: 12 - 20   4 (L)   Calcium Latest Ref Range: 8.5 - 10.1 MG/DL 9.1   Phosphorus Latest Ref Range: 2.5 - 4.9 MG/DL 6.0 (H)   GFR est non-AA Latest Ref Range: >60 ml/min/1.39m 3 (L)   GFR est AA Latest Ref Range: >60 ml/min/1.74m3 (L)   Bilirubin, total Latest Ref Range: 0.2 - 1.0 MG/DL 0.5   Protein, total Latest Ref Range: 6.4 - 8.2 g/dL 7.4   Albumin Latest Ref Range: 3.4 - 5.0 g/dL 3.2 (L)    Globulin Latest Ref Range: 2.0 - 4.0 g/dL 4.2 (H)   A-G Ratio Latest Ref Range: 0.8 - 1.7   0.8   ALT (SGPT) Latest Ref Range: 16 - 61 U/L 99 (H)   AST Latest Ref Range: 15 - 37 U/L 56 (H)   Alk. phosphatase Latest Ref Range: 45 - 117 U/L 108   PTH INTACT Unknown    Hepatitis B surface Ag Latest Ref Range: <1.00 Index <0.10   Hep B surface Ag Interp. Latest Ref Range: NEG   NEGATIVE   Hepatitis B surface Ab Latest Ref Range: >10.0 mIU/mL 27.84   Hep B surface Ab Interp. Latest Ref Range: POS   POSITIVE   Hep B surface Ab comment Latest Units:   Samples with  a  v.Marland KitchenMarland Kitchen   HEP B SURFACE AG Unknown Rpt                                                                                DIET:  _0  Renal    _1  Other     _2  NPO     _3   Diabetic      PRIMARY NURSE REPORT: First initial/Last name/Title      Pre Dialysis: Ladona Mow, RN   _4 _5 _6 _7 _8 _9 _10 _11 _12 _13 _14 _15 _16 _17 _18 _19 _20 _21 _22 _23 _24 _25 _26 _27 _28 _29 _30 _31 _32 _33 _34 _35 _36 _37 _38 _39 _40 _41 _42 _43 _44 _45 _46 _47 _48 _49 _50 _51 _52 _53 _54 _55 _56 _57 _58 _59 _60 _61 _62 _63 _64 _65 _66 _67 _68 _69 _70 _71 _72 _73 _74 _75 _76 _77 _78 _79 _80 _81 _82 _83 _84 _85 _86 _87 _88 _89 _90 _91 _92 _93 _94 _95 _96 _97 _98 _99

## 2017-06-22 NOTE — Progress Notes (Signed)
RENAL PROGRESS NOTE: Pt seen on dialysis.  Follow up of ESRD            Subjective: Complains of headache, undergoing Cardiac work up, so far negative.  Still claims he had cardiac cath  [facility] [place] could not find any record. Had cardiac cath in [place] in the past.    Patient is on Dialysis.     Objective:    Patient Vitals for the past 12 hrs:   Temp Pulse Resp BP SpO2   06/22/17 1147 - 70 - (!) 185/97 -   06/22/17 1120 97.6 ??F (36.4 ??C) 71 18 (!) 194/108 -   06/22/17 0832 - - - 175/83 -   06/22/17 0739 97 ??F (36.1 ??C) 65 18 179/84 96 %   06/22/17 0411 98 ??F (36.7 ??C) 72 18 175/83 94 %        Intake/Output Summary (Last 24 hours) at 06/22/17 1229  Last data filed at 06/22/17 0419   Gross per 24 hour   Intake              480 ml   Output                0 ml   Net              480 ml       Physical Assessment:     General Appearance: NAD  Lung: clear to auscultation  Heart: regular rate and rhythm and no murmurs, clicks or gallops  Lower Extremities: edema   Access:(R) arm AVF, qb 400    Labs    CBC w/Diff    Recent Labs      06/22/17   0443  06/21/17   1319   WBC  4.9  5.3   RBC  3.80*  3.90*   HGB  12.6*  12.9*   HCT  37.3  37.8   MCV  98.2*  96.9   MCH  33.2  33.1   MCHC  33.8  34.1   RDW  12.9  12.8    Recent Labs      06/22/17   0443  06/21/17   1319   MONOS  14*  9   EOS  10*  8*   BASOS  0  0   RDW  12.9  12.8        Comprehensive Metabolic Profile    Recent Labs      06/22/17   0443  06/21/17   1319   NA  137  137   K  5.1  4.6   CL  99*  98*   CO2  25  26   BUN  82*  66*   CREA  19.20*  17.40*    Recent Labs      06/22/17   0443  06/21/17   1319   CA  8.3*   9.1  9.2   PHOS  6.0*  4.0   ALB  3.2*  3.3*   TP  7.4  7.9   SGOT  56*  59*   TBILI  0.5  0.4          Basic Metabolic Profile       results  reviwed.          MEDS:Reviwed.  Current Facility-Administered Medications   Medication Dose Route Frequency Provider Last Rate Last Dose    ??? 0.9% sodium chloride infusion 250 mL  250 mL IntraVENous ONCE  Edilia Bo, MD       ??? acetaminophen (TYLENOL) tablet 650 mg  650 mg Oral DIALYSIS PRN Loreli Dollar, MD       ??? [START ON 06/23/2017] isosorbide mononitrate ER (IMDUR) tablet 120 mg  120 mg Oral DAILY Jerlyn Ly, MD       ??? amLODIPine (NORVASC) tablet 10 mg  10 mg Oral DAILY Theodis Aguas, MD       ??? aspirin delayed-release tablet 81 mg  81 mg Oral EVERY OTHER DAY Theodis Aguas, MD   81 mg at 06/21/17 1819   ??? atorvastatin (LIPITOR) tablet 40 mg  40 mg Oral QHS Theodis Aguas, MD   40 mg at 06/21/17 2121   ??? B complex-vitaminC-folic acid (NEPHROCAP) cap  1 Cap Oral DAILY Theodis Aguas, MD       ??? calcium acetate (PHOSLO) capsule 1,334 mg  1,334 mg Oral TID WITH MEALS Theodis Aguas, MD   1,334 mg at 06/21/17 1819   ??? carvedilol (COREG) tablet 25 mg  25 mg Oral BID Theodis Aguas, MD   25 mg at 06/21/17 1819   ??? docusate sodium (COLACE) capsule 100 mg  100 mg Oral BID PRN Theodis Aguas, MD       ??? albuterol-ipratropium (DUO-NEB) 2.5 MG-0.5 MG/3 ML  3 mL Nebulization Q6H PRN Theodis Aguas, MD       ??? losartan (COZAAR) tablet 50 mg  50 mg Oral DAILY Theodis Aguas, MD       ??? ondansetron (ZOFRAN) injection 4 mg  4 mg IntraVENous Q4H PRN Theodis Aguas, MD       ??? heparin (porcine) injection 5,000 Units  5,000 Units SubCUTAneous Q8H Theodis Aguas, MD   5,000 Units at 06/21/17 1819   ??? hydrALAZINE (APRESOLINE) tablet 100 mg  100 mg Oral Q8H Theodis Aguas, MD   100 mg at 06/22/17 1610   ??? morphine injection 2 mg  2 mg IntraVENous Q4H PRN Theodis Aguas, MD   2 mg at 06/21/17 2122       Impression:    ESRD: Tolerating HD well.  Anemia of CKD:  Hb is high, not on Epogen.  Hyperparathyroidism Yes  Atypical chest pain.  Plan  Dialysis for volume and solute management  .  Hectorol : not today.  Will Dialyze him again tomorrow. Acetaminophen for Headache.  Will Dialyze him again tomorrow.        Loreli Dollar, MD

## 2017-06-22 NOTE — Other (Signed)
Bedside shift change report given to T. Straka, RN (oncoming nurse) by Kari, RN (offgoing nurse). Report included the following information SBAR, Kardex and Recent Results.

## 2017-06-22 NOTE — Progress Notes (Signed)
Patient injected with 9.87 millicuries of 6199mTc-Sestamibi for Nuclear resting images.  Patient injected with 30.4 millicuries of 6599mTc-Sestamibi for Nuclear stress images.   Injected with 0.4mg  Lexiscan.    Patient armband left on and sent to dialysis

## 2017-06-22 NOTE — Other (Signed)
Bedside and Verbal shift change report given to Sherrilyn Ristkari, Charity fundraiserN (oncoming nurse) by Eduard ClosZaneta L Hobbs, RN   (offgoing nurse). Report included the following information SBAR, Kardex, Procedure Summary and Intake/Output.

## 2017-06-23 ENCOUNTER — Inpatient Hospital Stay: Admit: 2017-06-23 | Payer: MEDICAID | Primary: Family Medicine

## 2017-06-23 LAB — METABOLIC PANEL, BASIC
Anion gap: 7 mmol/L (ref 3.0–18)
BUN/Creatinine ratio: 3 — ABNORMAL LOW (ref 12–20)
BUN: 44 MG/DL — ABNORMAL HIGH (ref 7.0–18)
CO2: 30 mmol/L (ref 21–32)
Calcium: 8.4 MG/DL — ABNORMAL LOW (ref 8.5–10.1)
Chloride: 99 mmol/L — ABNORMAL LOW (ref 100–108)
Creatinine: 13.1 MG/DL — ABNORMAL HIGH (ref 0.6–1.3)
GFR est AA: 5 mL/min/{1.73_m2} — ABNORMAL LOW (ref >60)
GFR est non-AA: 4 mL/min/{1.73_m2} — ABNORMAL LOW (ref >60)
Glucose: 105 mg/dL — ABNORMAL HIGH (ref 74–99)
Potassium: 4.8 mmol/L (ref 3.5–5.5)
Sodium: 136 mmol/L (ref 136–145)

## 2017-06-23 LAB — CBC WITH AUTOMATED DIFF
ABS. BASOPHILS: 0 10*3/uL (ref 0.0–0.1)
ABS. EOSINOPHILS: 0.5 10*3/uL — ABNORMAL HIGH (ref 0.0–0.4)
ABS. LYMPHOCYTES: 1.3 10*3/uL (ref 0.9–3.6)
ABS. MONOCYTES: 0.8 10*3/uL (ref 0.05–1.2)
ABS. NEUTROPHILS: 2.6 10*3/uL (ref 1.8–8.0)
BASOPHILS: 0 % (ref 0–2)
EOSINOPHILS: 10 % — ABNORMAL HIGH (ref 0–5)
HCT: 37.8 % (ref 36.0–48.0)
HGB: 13.1 g/dL (ref 13.0–16.0)
LYMPHOCYTES: 24 % (ref 21–52)
MCH: 33.2 PG (ref 24.0–34.0)
MCHC: 34.7 g/dL (ref 31.0–37.0)
MCV: 95.9 FL (ref 74.0–97.0)
MONOCYTES: 15 % — ABNORMAL HIGH (ref 3–10)
MPV: 11.1 FL (ref 9.2–11.8)
NEUTROPHILS: 51 % (ref 40–73)
PLATELET: 124 10*3/uL — ABNORMAL LOW (ref 135–420)
RBC: 3.94 M/uL — ABNORMAL LOW (ref 4.70–5.50)
RDW: 12.7 % (ref 11.6–14.5)
WBC: 5.2 10*3/uL (ref 4.6–13.2)

## 2017-06-23 LAB — PHOSPHORUS: Phosphorus: 5.2 MG/DL — ABNORMAL HIGH (ref 2.5–4.9)

## 2017-06-23 MED ORDER — IOPAMIDOL 76 % IV SOLN
370 mg iodine /mL (76 %) | Freq: Once | INTRAVENOUS | Status: AC
Start: 2017-06-23 — End: 2017-06-23
  Administered 2017-06-23: 15:00:00 via INTRAVENOUS

## 2017-06-23 MED ORDER — ISOSORBIDE MONONITRATE SR 30 MG 24 HR TAB
30 mg | ORAL_TABLET | Freq: Every day | ORAL | 0 refills | Status: DC
Start: 2017-06-23 — End: 2017-07-06

## 2017-06-23 MED FILL — ISOSORBIDE MONONITRATE SR 60 MG 24 HR TAB: 60 mg | ORAL | Qty: 2

## 2017-06-23 MED FILL — HEPARIN (PORCINE) 5,000 UNIT/ML IJ SOLN: 5000 unit/mL | INTRAMUSCULAR | Qty: 1

## 2017-06-23 MED FILL — HYDRALAZINE 50 MG TAB: 50 mg | ORAL | Qty: 2

## 2017-06-23 MED FILL — MORPHINE 2 MG/ML INJECTION: 2 mg/mL | INTRAMUSCULAR | Qty: 1

## 2017-06-23 MED FILL — RENO CAPS 1 MG CAPSULE: 1 mg | ORAL | Qty: 1

## 2017-06-23 MED FILL — ATORVASTATIN 40 MG TAB: 40 mg | ORAL | Qty: 1

## 2017-06-23 MED FILL — AMLODIPINE 10 MG TAB: 10 mg | ORAL | Qty: 1

## 2017-06-23 MED FILL — ISOVUE-370  76 % INTRAVENOUS SOLUTION: 370 mg iodine /mL (76 %) | INTRAVENOUS | Qty: 100

## 2017-06-23 MED FILL — CALCIUM ACETATE 667 MG CAP: 667 mg | ORAL | Qty: 2

## 2017-06-23 NOTE — Other (Signed)
Received patient from dialysis  Patient states he wants to eat prior to going home  Patient has meal at bedside.  Dialysis called for report and patient update

## 2017-06-23 NOTE — Progress Notes (Signed)
Chaplain conducted an initial consultation and Spiritual Assessment for William Ruiz, who is a 51 y.o.,male. Patient???s Primary Language is: [place].   According to the patient???s EMR Religious Affiliation is: Unknown.     The reason the Patient came to the hospital is:   Patient Active Problem List    Diagnosis Date Noted   ??? Chest pain 06/21/2017   ??? ESRD (end stage renal disease) (HCC) 03/23/2017   ??? Hyperphosphatemia 03/23/2017   ??? Encounter for dialysis (HCC) 03/21/2017   ??? Essential hypertension 03/21/2017   ??? Mixed hyperlipidemia 03/21/2017        The Chaplain provided the following Interventions:  Initiated a relationship of care and support.   Explored issues of faith, belief, spirituality and religious/ritual needs while hospitalized.  Listened empathically.  Provided information about Spiritual Care Services.  Chart reviewed.    The following outcomes where achieved:  Patient shared limited information about both their medical narrative and spiritual journey/beliefs.  Chaplain confirmed Patient's Religious Affiliation.  Patient expressed gratitude for chaplain's visit.    Assessment:  Patient's faith in God is very important for him to cope.  Patient does not have any religious/cultural needs that will affect patient???s preferences in health care.  There are no spiritual or religious issues which require intervention at this time.     Plan:  Chaplains will continue to follow and will provide pastoral care on an as needed/requested basis.  Chaplain recommends bedside caregivers page chaplain on duty if patient shows signs of acute spiritual or emotional distress.    Chaplain Eilene GhaziKerry Pokines  Spiritual Care  [phone])

## 2017-06-23 NOTE — Other (Signed)
Patient still in dialysis report given to Tammy straka.

## 2017-06-23 NOTE — Progress Notes (Addendum)
Reason for Admission:   Chest pain                   RRAT Score:    11                 Plan for utilizing home health:                          Likelihood of Readmission:  Green/Low                         Transition of Care Plan:      Shelter      Care Management Interventions  Current Support Network: Relative's Home  Confirm Follow Up Transport: Cab  Plan discussed with Pt/Family/Caregiver: Yes  Discharge Location  Discharge Placement: Shelter              Pt states that he is from [place] and has been staying with his aunt to assist in her care. Pt mentions that staying with his aunt has been very uncomfortable recently and is seeking a placement at a shelter.         Pt shares that he has a friend that he will stay with. [facility] to [facility].

## 2017-06-23 NOTE — Progress Notes (Signed)
Progress Note    William Ruiz  51 y.o.      Admit Date: 06/21/2017  Active Problems:    Essential hypertension (03/21/2017) POA: Yes      Mixed hyperlipidemia (03/21/2017) POA: Yes      ESRD (end stage renal disease) on dialysis (HCC) (03/23/2017) POA: Yes      Hyperphosphatemia (03/23/2017) POA: Yes      Chest pain (06/21/2017) POA: Unknown            Subjective:     Patient feels good, no SOB,no Chest pain.Negative Cardiac wok up, no PE.      A comprehensive review of systems was negative except for that written in the History of Present Illness.    Objective:     Visit Vitals   ??? BP 151/75 (BP 1 Location: Left arm, BP Patient Position: At rest)   ??? Pulse 69   ??? Temp 97.5 ??F (36.4 ??C)   ??? Resp 16   ??? Ht 5\' 7"  (1.702 m)   ??? Wt 70.7 kg (155 lb 12.8 oz)   ??? SpO2 96%   ??? BMI 24.4 kg/m2         Intake/Output Summary (Last 24 hours) at 06/23/17 1443  Last data filed at 06/23/17 1610   Gross per 24 hour   Intake              300 ml   Output             3000 ml   Net            -2700 ml       Current Facility-Administered Medications   Medication Dose Route Frequency Provider Last Rate Last Dose   ??? acetaminophen (TYLENOL) tablet 650 mg  650 mg Oral DIALYSIS PRN Loreli Dollar, MD   650 mg at 06/22/17 1342   ??? doxercalciferol (HECTOROL) 4 mcg/2 mL injection 1 mcg  1 mcg IntraVENous Q MON, WED & FRI Loreli Dollar, MD       ??? isosorbide mononitrate ER (IMDUR) tablet 120 mg  120 mg Oral DAILY Edilia Bo, MD   Stopped at 06/23/17 0900   ??? amLODIPine (NORVASC) tablet 10 mg  10 mg Oral DAILY Theodis Aguas, MD   10 mg at 06/23/17 1235   ??? aspirin delayed-release tablet 81 mg  81 mg Oral EVERY OTHER DAY Theodis Aguas, MD   81 mg at 06/21/17 1819   ??? atorvastatin (LIPITOR) tablet 40 mg  40 mg Oral QHS Theodis Aguas, MD   40 mg at 06/22/17 2128   ??? B complex-vitaminC-folic acid (NEPHROCAP) cap  1 Cap Oral DAILY Theodis Aguas, MD   1 Cap at 06/23/17 [phone]   ??? calcium acetate (PHOSLO) capsule 1,334 mg  1,334 mg Oral TID WITH MEALS  Theodis Aguas, MD   1,334 mg at 06/23/17 1235   ??? carvedilol (COREG) tablet 25 mg  25 mg Oral BID Theodis Aguas, MD   Stopped at 06/23/17 0900   ??? docusate sodium (COLACE) capsule 100 mg  100 mg Oral BID PRN Theodis Aguas, MD       ??? albuterol-ipratropium (DUO-NEB) 2.5 MG-0.5 MG/3 ML  3 mL Nebulization Q6H PRN Theodis Aguas, MD       ??? losartan (COZAAR) tablet 50 mg  50 mg Oral DAILY Theodis Aguas, MD   Stopped at 06/23/17 0900   ??? ondansetron (ZOFRAN) injection 4 mg  4 mg  IntraVENous Q4H PRN Theodis Aguas, MD       ??? heparin (porcine) injection 5,000 Units  5,000 Units SubCUTAneous Q8H Theodis Aguas, MD   5,000 Units at 06/23/17 0850   ??? hydrALAZINE (APRESOLINE) tablet 100 mg  100 mg Oral Q8H Theodis Aguas, MD   Stopped at 06/23/17 1418   ??? morphine injection 2 mg  2 mg IntraVENous Q4H PRN Theodis Aguas, MD   2 mg at 06/23/17 1244        Physical Exam:     Physical Exam:   General:  Alert, cooperative, no distress, appears stated age.   Neck: Supple, symmetrical, trachea midline, no adenopathy, thyroid: no enlargement/tenderness/nodules, no carotid bruit and no JVD.   Lungs:   Clear to auscultation bilaterally.   Heart:  Regular rate and rhythm, S1, S2 normal, no murmur, click, rub or gallop.   Abdomen:   Soft, non-tender. Bowel sounds normal. No masses,  No organomegaly.   Extremities: Extremities normal, atraumatic, no cyanosis or edema, AVF has good thrill & bruit.   Skin: Skin color, texture, turgor normal. No rashes or lesions         Data Review:    CBC w/Diff    Recent Labs      06/23/17   0410  06/22/17   0443  06/21/17   1319   WBC  5.2  4.9  5.3   RBC  3.94*  3.80*  3.90*   HGB  13.1  12.6*  12.9*   HCT  37.8  37.3  37.8   MCV  95.9  98.2*  96.9   MCH  33.2  33.2  33.1   MCHC  34.7  33.8  34.1   RDW  12.7  12.9  12.8    Recent Labs      06/23/17   0410  06/22/17   0443  06/21/17   1319   MONOS  15*  14*  9   EOS  10*  10*  8*   BASOS  0  0  0   RDW  12.7  12.9  12.8         Comprehensive Metabolic Profile    Recent Labs      06/23/17   0410  06/22/17   0443  06/21/17   1319   NA  136  137  137   K  4.8  5.1  4.6   CL  99*  99*  98*   CO2  30  25  26    BUN  44*  82*  66*   CREA  13.10*  19.20*  17.40*    Recent Labs      06/23/17   0410  06/22/17   0443  06/21/17   1319   CA  8.4*  8.3*   9.1  9.2   PHOS  5.2*  6.0*  4.0   ALB   --   3.2*  3.3*   TP   --   7.4  7.9   SGOT   --   56*  59*   TBILI   --   0.5  0.4                    Impression:       Active Hospital Problems    Diagnosis Date Noted   ??? Chest pain 06/21/2017   ??? ESRD (end stage renal disease) on dialysis (HCC) 03/23/2017   ???  Hyperphosphatemia 03/23/2017   ??? Essential hypertension 03/21/2017   ??? Mixed hyperlipidemia 03/21/2017            Plan:      Dialysis today as planned  & then DC home with the plan off contnued dialysis as out patient as scheduled.      Loreli DollarMosta Chayil Gantt, MD

## 2017-06-23 NOTE — Other (Signed)
Bedside and Verbal shift change report given to Darel HongJudy, Charity fundraiserN (oncoming nurse) by Court Joyamara Straka   (offgoing nurse). Report included the following information SBAR, Kardex, Procedure Summary and Intake/Output.

## 2017-06-23 NOTE — Other (Signed)
ACUTE HEMODIALYSIS FLOW SHEET    HEMODIALYSIS ORDERS: Physician: haque      Dialyzer: revaclear         Duration: 4 hr  BFR: 800   DFR: 400   Dialysate:  Temp *37 K+   2    Ca+  2.5 Na 138 Bicarb 35   Weight:  70.7 kg    Bed Scale [x]      Unable to Obtain []       Dry weight/UF Goal: 2000 Access AVF  Needle Gauge 15    Heparin [x]   Bolus      Units    [x]  Hourly       Units    [] None     Catheter locking solution    Pre BP:   173/93    Pulse:     78     Temperature:   98.3  Respirations: 16  Tx: NS       ml/Bolus  Other        [x]  [name]   Labs: Pre        Post:        [x]  [name]   Additional Orders(medications, blood products, hypotension management):       [x]  [name]     [x]  Time Out/Safety Check  [x]  [facility] Consent Verified     CATHETER ACCESS: [x] [name]   [] Right   [] Left   [] IJ     [] Fem   []  First use X-ray verified     [] Tunnel                []  Non Tunneled   [] No S/S infection  [] Redness  [] Drainage [] Cultured [] Swelling [] Pain   [] Medical Aseptic Prep Utilized   [] Dressing Changed  []  Biopatch  Date:       [] Clotted   [x] Patent   Flows: [x] Good  [] Poor  [] Reversed   If access problem, Dr. notified: [] Yes    [] [name]  Date:           GRAFT/FISTULA ACCESS:  [] [name]     [x] Right     [] Left     [x] UE     [] LE   [x] AVG   [] AVF        [] Buttonhole    [x] Medical Aseptic Prep Utilized   [x] No S/S infection  [] Redness  [] Drainage [] Cultured [] Swelling [] Pain    Bruit:   [x]  Strong    []  Weak       Thrill :   [x]  Strong    []  Weak       Needle Gauge: 15   Length: 1   If access problem, Dr. notified: [] Yes     [x] [name]  Date:        Please describe access if present and not used:       GENERAL ASSESSMENT:    LUNGS:  Rate  SaO2%        [x]  [name]    [x]  Clear  []  Coarse  []  Crackles  []  Wheezing        []  Diminished     Location : [] RLL   [] LLL    [] RUL  [] LUL   Cough: [] Productive  [] Dry  [x] [name]   Respirations:  [x] Easy  [] Labored   Therapy:  [x] RA  [] NC  l/min    Mask: [] NRB [] Venti       O2%                   [] Ventilator  [] Intubated  []   Trach  []  BiPaP   CARDIAC: [x] Regular      []  Irregular   []  Pericardial Rub  []  JVD        []   Monitored  []  Bedside  []  Remotely monitored []  [name]  Rhythm:    EDEMA: [x]  None  [] Generalized  []  Pitting []  1    []  2    []  3    []  4                 []  Facial  []  Pedal  []   UE  []  LE   SKIN:   [x]  Warm  []  Hot     []  Cold   [x]  Dry     []  Pale   []  Diaphoretic                  []  Flushed  []  Jaundiced  []  Cyanotic  []  Rash  []  Weeping   LOC:    [x]  Alert      [x] Oriented:    [x]  Person     [x]  Place  [x] Time               []  Confused  []  Lethargic  []  Medicated  []  Non-responsive     GI / ABDOMEN:  []  Flat    []  Distended    [x]  Soft    []  Firm   []   Obese                             []  Diarrhea  [x]  Bowel Sounds  []  Nausea  []  Vomiting      GU / URINE ASSESSMENT:[]  Voiding   [x]  Oliguria  []  Anuria   []   Foley     []  Incontinent    []   Incontinent Brief      []   Bathroom Privileges     PAIN: [x]  0 [] 1  [] 2   [] 3   [] 4   [] 5   [] 6   [] 7   [] 8   [] 9   [] 10            Scale 0-10  Action/Follow Up:    MOBILITY:  []  Amb    []  Amb/Assist    [x]  Bed    []  Wheelchair  []  Stretcher      All Vitals and Treatment Details on Attached Flowsheet       Hospital: [facility]          Room # 214     []  1st Time Acute  []  Stat  [x]  Routine  []  Urgent     [x]  Acute Room  []   Bedside  []  ICU/CCU  []  ER   Isolation Precautions:  [x]  Dialysis   []  Airborne   []  Contact    []  Reverse   Special Considerations:         []  Blood Consent Verified [x] [name]     ALLERGIES:    Reaction Severity Reaction Type Noted Valid Until Updated            Allergies   Penicillins Anaphylaxis High  03/21/2017  Past Updates...   Levofloxacin Other (comments)   03/21/2017  Past Updates...   Vicodin [Hydrocodone-acetaminophen]             []  NKA          Code Status:  [x]  Full Code  []  DNR  []  Other  HBsAg ONLY: Date Drawn 06/22/17         [x] Negative [] Positive [] Unknown    HBsAb: Date 06/22/17    []  Susceptible   [x]  Immune10 [] Not Drawn  []  Drawn     Current Labs:    Date of Labs:           Today [x]         Cut and paste current labs here.                                                                                                                                  DIET:  [x]  Renal    []  Other     []  NPO     []   Diabetic      PRIMARY NURSE REPORT: First initial/Last name/Title      Pre Dialysis: William ApleyJ Paxton, RN    Time: 1500      EDUCATION:    [x]  Patient []  Other         Knowledge Basis: [] None [x] Minimal []  Substantial   Barriers to learning  [x] [name]   []  Access Care     []  S&S of infection     []  Fluid Management     [] K+     [x] Procedural    [] Albumin     []  Medications     []  Tx Options     []  Transplant     []  Diet     []  Other   Teaching Tools:  [x]  Explain  []  Demo  []  Handouts []  Video  Patient response:   [x]  Verbalized understanding  []  Teach back  []  Return demonstration []  Requires follow up   Inappropriate due to            RO/HEMODIALYSIS MACHINE SAFETY CHECKS ??? Before each treatment:     Machine [facility]                   [facility]                                  [x]  Unit Machine # 7 with centralized RO                                  []  Portable Machine #1/RO serial # [id number]                                  []  Portable Machine #2/RO serial # [id number]                                  []  Portable Machine #3/RO serial # [id number]  [facility]                                  []  Portable Machine #11/RO serial # [id number]                                   []  Portable Machine #12/RO serial # [id number]                                  []  Portable Machine #13/RO serial #  [id number]      Alarm Test:  Pass time 1500         Other:         [x]  RO/Machine Log Complete      Temp    37            [x] Extracorporeal Circuit Tested for integrity    Dialysate: pH  7.4 Conductivity: Meter   14     HD Machine   14                  TCD: 14  Dialyzer Lot # [id number]        Blood Tubing Lot # [id number]     Saline Lot #  [id number]     CHLORINE TESTING-Before each treatment and every 4 hours    Total Chlorine: [x]  less than 0.1 ppm  Time: 1300 4 Hr/2nd Check Time: 1700   (if greater than 0.1 ppm from Primary then every 30 minutes from Secondary)     TREATMENT INITIATION ??? with Dialysis Precautions:   [x]  All Connections Secured                 [x]  Saline Line Double Clamped   [x]  Venous Parameters Set                  [x]  Arterial Parameters Set    [x]  Prime Given  250 ml               [x] Air Foam Detector Engaged      Treatment Initiation Note: pt arrived in stable condition via bed AVF accessed and treatment initiated without difficulty.      Medication Dose Volume Route Initials Dialyzer Cleared: []  Good [x]  Fair  []  Poor    Blood processed:  74.7 L  UF Removed  2000 Ml    Post Wt: 68.7    kg  POst BP:   149/80       Pulse: 72      Respirations: 16  Temperature: 98                                   Post Tx Vascular Access: AVF/AVG: Bleeding stopped Art 15 min. Ven. 15 Min                                      Catheter: Locking solution: Heparin 1:1000 Art.   Ven.   [name]  Post Assessment:                                    Skin:  [x]  Warm  [x]  Dry []  Diaphoretic    []  Flushed  []  Pale []  Cyanotic   [facility] Signatures Title Initials  Time Lungs: [x]  Clear    []  Course  []  Crackles  []  Wheezing []  Diminished   William Denmark, RN    Cardiac: [x]  Regular   []  Irregular   []  Monitor  []  [name]  Rhythm:           Edema:  []  None    []  General     []  Facial   []  Pedal    []  UE    []  LE       Pain: [x] 0  [] 1  [] 2   [] 3  [] 4   [] 5   [] 6   [] 7   [] 8   [] 9   [] 10         Post Treatment Note: HD well tolerated. 2L UF removed.  No acute distress noted during or post HD treatment.     POST TREATMENT PRIMARY NURSE HANDOFF REPORT:      First initial/Last name/Title         Post Dialysis: William Pel, RN Time:  2000     Abbreviations: AVG-arterial venous graft, AVF-arterial venous fistula, IJ-Internal Jugular, Subcl-Subclavian, Fem-Femoral, Tx-treatment, AP/HR-apical heart rate, DFR-dialysate flow rate, BFR-blood flow rate, AP-arterial pressure, VP-venous pressure, UF-ultrafiltrate, TMP-transmembrane pressure, Ven-Venous, Art-Arterial, RO-Reverse Osmosis
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
## 2017-06-23 NOTE — Progress Notes (Signed)
Problem: Falls - Risk of  Goal: *Absence of Falls  Document Schmid Fall Risk and appropriate interventions in the flowsheet.   Outcome: Progressing Towards Goal  Fall Risk Interventions:            Medication Interventions: Patient to call before getting OOB, Teach patient to arise slowly
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
## 2017-06-24 LAB — NM STRESS TEST WITH MYOCARDIAL PERFUSION
Baseline HR: 68 {beats}/min
Left Ventricular Ejection Fraction: 46
Nuc Rest EF: 46 %
Recovery Stage 1 Duration: 1 min:sec
Recovery Stage 1 HR: 87 {beats}/min
Stress Diastolic BP: 96 mmHg
Stress Estimated Workload: 1 METS
Stress Peak HR: 87 {beats}/min
Stress Rate Pressure Product: 17922 BPM*mmHg
Stress ST Depression: 0 mm
Stress ST Elevation: 0 mm
Stress Stage 1 Duration: 1 min:sec
Stress Stage 1 HR: 68 {beats}/min
Stress Stage 2 Duration: 1 min:sec
Stress Stage 2 HR: 72 {beats}/min
Stress Stage 3 Duration: 1 min:sec
Stress Stage 3 HR: 84 {beats}/min
Stress Systolic BP: 206 mmHg
Stress Target HR: 143 {beats}/min
TID: 1.04

## 2017-06-28 ENCOUNTER — Inpatient Hospital Stay
Admit: 2017-06-28 | Discharge: 2017-06-29 | Disposition: A | Discharge status: Home / Self Care | Payer: MEDICAID | Attending: Emergency Medicine

## 2017-06-28 DIAGNOSIS — T82838A Hemorrhage of vascular prosthetic devices, implants and grafts, initial encounter: Secondary | ICD-10-CM

## 2017-06-28 NOTE — ED Notes (Signed)
Dressing placed with quick clot, bulky dressing with gauze and coban, pts wound still oozing when dialysis clamp removed, pt states it has been bleeding for 3 hours now, states no ASA for 24 hours, no heparin at dialysis.

## 2017-06-28 NOTE — ED Triage Notes (Addendum)
Pt presents to the ED via EMS for evaluation of dialysis shunt bleeding. EMS report that pt had a normal run of dialysis however his site would not stop bleeding per dialysis staff. Pt reports that he has had trouble with his dialysis site bleeding uneventfully with last episode Friday. Pt arrives with dialysis clamp in place.

## 2017-06-28 NOTE — ED Provider Notes (Signed)
Sondra Barges  [facility] EMERGENCY DEPT      8:12 PM    Date: 06/28/2017  Patient Name: William Ruiz    History of Presenting Illness     Chief Complaint   Patient presents with   ??? Vascular Access Problem       History Provided By: Patient    Chief Complaint: Bleeding from the dialysis catheter  Duration:  Hours  Timing:  Acute and Constant  Location: Right arm  Quality: [name]  Severity: Moderate  Modifying Factors: None  Associated Symptoms: denies any other associated signs or symptoms    51 y.o. male with noted past medical history who presents to the emergency department for evaluation of bleeding from his dialysis catheter for the past three hours. He reports that he completed a full session of dialysis today and afterwards they were unable to stop the dialysis site from bleeding. They applied a clamp for 20 minutes, 30 minutes, and 90 minutes without resolution of the bleeding, so they sent him to the ED with the clamp in place. He has had the same dialysis fistula in place for the past 15 years and has not had any major issues with it. He has been attending dialysis regularly and has been undergoing dialysis for the past 44 years. He stays active, running six miles every other day. No further complaints at this time.    Nursing notes regarding the HPI and triage nursing notes were reviewed.     Prior medical records were reviewed.     Current Outpatient Prescriptions   Medication Sig Dispense Refill   ??? isosorbide mononitrate ER (IMDUR) 30 mg tablet Take 4 Tabs by mouth daily. 30 Tab 0   ??? atorvastatin (LIPITOR) 40 mg tablet Take 1 Tab by mouth nightly. 30 Tab 0   ??? docusate sodium (COLACE) 100 mg capsule Take 1 Cap by mouth two (2) times daily as needed for Constipation. 30 Cap 0   ??? hydrALAZINE (APRESOLINE) 100 mg tablet Take 1 Tab by mouth three (3) times daily. 90 Tab 0   ??? ipratropium-albuterol (COMBIVENT RESPIMAT) 20-100 mcg/actuation inhaler Take 1 Puff by inhalation every six (6) hours as needed for Wheezing. 1  Inhaler 0   ??? losartan (COZAAR) 50 mg tablet Take 1 Tab by mouth daily. 30 Tab 0   ??? carvedilol (COREG) 25 mg tablet Take 25 mg by mouth two (2) times a day.     ??? calcium acetate (PHOSLO) 667 mg tab tablet Take 1,334 mg by mouth three (3) times daily (with meals).     ??? cinacalcet (SENSIPAR) 60 mg tab Take 60 mg by mouth.     ??? amLODIPine (NORVASC) 10 mg tablet Take 10 mg by mouth daily.     ??? b complex-vitamin c-folic acid (NEPHROCAPS) 1 mg capsule Take 1 Cap by mouth daily.     ??? aspirin delayed-release 81 mg tablet Take 81 mg by mouth every other day.         Past History     Past Medical History:  Past Medical History:   Diagnosis Date   ??? Benign essential HTN    ??? ESRD (end stage renal disease) on dialysis (HCC)    ??? HLD (hyperlipidemia)        Past Surgical History:  Past Surgical History:   Procedure Laterality Date   ??? HX ARTERIOVENOUS FISTULA         Family History:  History reviewed. No pertinent family history.  Social History:  Social History   Substance Use Topics   ??? Smoking status: Never Smoker   ??? Smokeless tobacco: Never Used   ??? Alcohol use No       Allergies:  Allergies   Allergen Reactions   ??? Penicillins Anaphylaxis   ??? Compazine [Prochlorperazine] Anxiety   ??? Levofloxacin Other (comments)   ??? Vicodin [Hydrocodone-Acetaminophen] Other (comments)       Patient's primary care provider (as noted in EPIC):  None    Review of Systems   Constitutional: Negative for diaphoresis.   HENT: Negative for congestion.    Eyes: Negative for discharge.   Respiratory: Negative for stridor.    Cardiovascular: Negative for palpitations.   Gastrointestinal: Negative for diarrhea.   Genitourinary: Negative for flank pain.   Musculoskeletal: Negative for back pain.        Positive for bleeding from his dialysis catheter   Neurological: Negative for weakness.   Psychiatric/Behavioral: Negative for hallucinations.   All other systems reviewed and are negative.      Visit Vitals    ??? BP (!) 164/92 (BP 1 Location: Right arm, BP Patient Position: At rest)   ??? Pulse 64   ??? Temp 98.5 ??F (36.9 ??C)   ??? Resp 15   ??? SpO2 98%       PHYSICAL EXAM:    CONSTITUTIONAL:  Alert, in no apparent distress;  well developed;  well nourished.  HEAD:  Normocephalic, atraumatic.  EYES:  EOMI.  Non-icteric sclera.  Normal conjunctiva.  ENTM:  Nose:  no rhinorrhea.  Throat:  no erythema or exudate, mucous membranes moist.  NECK:  No JVD.  Supple  RESPIRATORY:  Chest clear, equal breath sounds, good air movement.  CARDIOVASCULAR:  Regular rate and rhythm.  No murmurs, rubs, or gallops.  GI:  Normal bowel sounds, abdomen soft and non-tender.  No rebound or guarding.  BACK:  Non-tender.  UPPER EXT:  Normal inspection.  LOWER EXT:  No edema, no calf tenderness.  Distal pulses intact.  NEURO:  Moves all four extremities, and grossly normal motor exam.  PSYCH:  Alert and normal affect.  SKIN:  Normal for age.      Site of hemodialysis shunt:  Right upper arm has spring loaded clamp on hemodialysis shunt.  Good thrill.  No cellulitis.  No tenderness to palpation.  No rash, lesions or bites.    Diagnostic Study Results     Abnormal lab results from this emergency department encounter:  Labs Reviewed - No data to display    Lab values for this patient within approximately the last 12 hours:  No results found for this or any previous visit (from the past 12 hour(s)).    Radiologist and cardiologist interpretations if available at time of this note:  No results found.    Medication(s) ordered for patient during this emergency visit encounter:  Medications - No data to display    Medical Decision Making     I am the first provider for this patient.    I reviewed the vital signs, available nursing notes, past medical history, past surgical history, family history and social history.    Vital Signs:  Reviewed the patient's vital signs.    9:01 PM  Patient has been resting on stretcher with quick clot covered with gauze  and pressure dressing with no bleeding noted.       ED COURSE:  A pressure bandage was applied to the bleeding HD shunt site, and  on rechecks there was no bleeding from the prior bleeding site.     Follow-up Activity limitations:  None    Condition on Discharge:  Stable     DIAGNOSIS:  1.  Bleeding from HD shunt site, right upper arm, controlled in the emergency department.    SPECIFIC PATIENT INSTRUCTIONS FROM THE PHYSICIAN WHO TREATED YOU IN THE ER TODAY:  1.  Return if any concerns or worsening of condition(s), or if the shunt starts rebleeding.   2.  Keep the pressure bandage on for the next 12 hours before removing it.    3.  FOLLOW UP APPOINTMENT:  Your primary doctor or nephrologist in the next couple days for reevaluation of the bleeding HD shunt.     Patient is improved, resting quietly and comfortably.  The patient will be discharged home.     The patient was reassured that these symptoms do not appear to represent a serious or life threatening condition at this time. Warning signs of worsening condition were discussed and understood by the patient.     Based on patient's age, coexisting illness, exam, and the results of this ED evaluation, the decision to treat as an outpatient was made. Based on the information available at time of discharge, acute pathology requiring immediate intervention was deemed relative unlikely.     While it is impossible to completely exclude the possibility of underlying serious disease or worsening of condition, I feel the relative likelihood is extremely low. I discussed this uncertainty with the patient, who understood ED evaluation and treatment and felt comfortable with the outpatient treatment plan.     All questions regarding care, test results, and follow up were answered. The patient is stable and appropriate to discharge. They understand that they should return to the emergency department for any new or worsening  symptoms. I stressed the importance of follow up for repeat assessment and possibly further evaluation/treatment.    Coding Diagnoses     Clinical Impression:   1. Bleeding from dialysis shunt, initial encounter (HCC)    2. Dialysis patient [facility])        Disposition     Disposition:  Home.    Arlys John L. Westley Foots, M.D.  [employer] Board Certified Emergency Physician    Provider Attestation:  If a scribe was utilized in generation of this patient record, I personally performed the services described in the documentation, reviewed the documentation, as recorded by the scribe in my presence, and it accurately records the patient's history of presenting illness, review of systems, patient physical examination, and procedures performed by me as the attending physician.     Arlys John L. Westley Foots, M.D.  [employer] Board Certified Emergency Physician  06/28/2017.  8:13 PM\\

## 2017-06-28 NOTE — ED Notes (Signed)
I have reviewed discharge instructions with the patient.  The patient verbalized understanding. No signs of bleeding or seeping through bandage, pt ambulated to the lobby without difficulty

## 2017-07-06 ENCOUNTER — Ambulatory Visit: Attending: Physician Assistant | Primary: Family Medicine

## 2017-07-06 ENCOUNTER — Ambulatory Visit
Admit: 2017-07-06 | Discharge: 2017-07-06 | Payer: PRIVATE HEALTH INSURANCE | Attending: Physician Assistant | Primary: Family Medicine

## 2017-07-06 DIAGNOSIS — N186 End stage renal disease: Secondary | ICD-10-CM

## 2017-07-06 NOTE — Progress Notes (Signed)
William Ruiz    Chief Complaint   Patient presents with   ??? New Patient   ??? End Stage Renal Disease       History and Physical    William Ruiz is a new patient here to [place] today.  But he is not new at all to dialysis.  He recently moved here from [place] to help care for an ailing family member.  He has been on dialysis for 44 years, having had to start when he was about 51 years old due to severe hypertension and kidney failure.  He has had multiple left upper extremity accesses, a right forearm access, and then for 15 years he has maintained his current right arm fistula, which he says does have some graft component.  It been about 3 months since he last had intervention before he moved here.  When he comes in for today is that for the past few treatments he has had prolonged bleeding after his treatments.  This has included several ER visits.  He was referred for vascular evaluation.    In spite of being on dialysis he is very healthy.  He says he runs about 6 miles per day and is very active.  He is very in tune with his diet and medication management, which is actually quite minimal, to maintain healthy lifestyle.  He understands the need for dialysis and values being able to try to keep his access functional.    Past Medical History:   Diagnosis Date   ??? Benign essential HTN    ??? ESRD (end stage renal disease) on dialysis [facility])    ??? HLD (hyperlipidemia)      Patient Active Problem List   Diagnosis Code   ??? Encounter for dialysis (HCC) Z99.2   ??? Essential hypertension I10   ??? Mixed hyperlipidemia E78.2   ??? ESRD (end stage renal disease) on dialysis (HCC) N18.6, Z99.2   ??? Hyperphosphatemia E83.39   ??? Chest pain R07.9     Past Surgical History:   Procedure Laterality Date   ??? HX ARTERIOVENOUS FISTULA       Current Outpatient Prescriptions   Medication Sig Dispense Refill   ??? folic acid/vit B complex and C (RENA-VITE PO) Take  by mouth.     ??? atorvastatin (LIPITOR) 40 mg tablet Take 1 Tab by mouth nightly. 30 Tab  0   ??? docusate sodium (COLACE) 100 mg capsule Take 1 Cap by mouth two (2) times daily as needed for Constipation. 30 Cap 0   ??? hydrALAZINE (APRESOLINE) 100 mg tablet Take 1 Tab by mouth three (3) times daily. 90 Tab 0   ??? ipratropium-albuterol (COMBIVENT RESPIMAT) 20-100 mcg/actuation inhaler Take 1 Puff by inhalation every six (6) hours as needed for Wheezing. 1 Inhaler 0   ??? calcium acetate (PHOSLO) 667 mg tab tablet Take 1,334 mg by mouth three (3) times daily (with meals).     ??? aspirin delayed-release 81 mg tablet Take 81 mg by mouth every other day.       Allergies   Allergen Reactions   ??? Penicillins Anaphylaxis   ??? Compazine [Prochlorperazine] Anxiety   ??? Levofloxacin Other (comments)   ??? Vicodin [Hydrocodone-Acetaminophen] Other (comments)     Social History     Social History   ??? Marital status: SINGLE     Spouse name: [name]   ??? Number of children: [name]   ??? Years of education: [name]     Occupational History   ???  Not on file.     Social History Main Topics   ??? Smoking status: Never Smoker   ??? Smokeless tobacco: Never Used   ??? Alcohol use No   ??? Drug use: No   ??? Sexual activity: No     Other Topics Concern   ??? Not on file     Social History Narrative      No family history on file.    Review of Systems    10 point ROS negative unless stated in HPI above     Physical Exam:    Visit Vitals   ??? BP 122/70 (BP 1 Location: Left arm, BP Patient Position: Sitting)   ??? Pulse 68   ??? Resp 14   ??? Ht 5\' 7"  (1.702 m)   ??? Wt 155 lb (70.3 kg)   ??? BMI 24.28 kg/m2      General:  Alert, cooperative, no distress.   Head:  Normocephalic, without obvious abnormality, atraumatic.   Eyes:    Conjunctivae/corneas clear. Pupils equal, round, reactive to light. Extraocular movements intact.   Extremities: Extremities normal, atraumatic, no cyanosis or edema.  Functional right upper arm fistula/graft. Some aneurysmal changes with some skin thinning but no ulcerations or active bleeding    Pulses: 2+ and symmetric all extremities.    Skin: Skin color, texture, turgor normal. No rashes or lesions.       Impression and Plan:  1. ESRD (end stage renal disease) on dialysis (HCC)    2. Complication of AV dialysis fistula, initial encounter      Orders Placed This Encounter   ??? folic acid/vit B complex and C (RENA-VITE PO)     I explained the episode the prolonged bleeding could be an outflow stenosis.  I do appreciate the areas of pseudoaneurysm and skin thinning, which may be something we would have to address down the road, I think for the time being just initial evaluation with a fistulogram with opportunity to do any intervention is reasonable.  He states he is very familiar with this having been through a number of times, and is anxious to do something as soon as possible because of these recent events.  In fact will be able to try to do this tomorrow prior to his scheduled dialysis treatment.  Dr. Ronne BinningMcKenzie also saw and evaluated the patient today as well    William Hatcherracy L Kristina Mcnorton, PA    Portions of this note have been created using voice recognition software.

## 2017-07-07 ENCOUNTER — Inpatient Hospital Stay: Payer: MEDICAID

## 2017-07-07 LAB — POC 6 PLUS
BUN, POC: 49 MG/DL — ABNORMAL HIGH (ref 7–18)
Chloride, POC: 102 MMOL/L (ref 100–108)
Glucose, POC: 87 MG/DL (ref 74–106)
Hematocrit, POC: 34 % — ABNORMAL LOW (ref 36–49)
Hemoglobin, POC: 11.6 G/DL — ABNORMAL LOW (ref 12–16)
Potassium, POC: 5.5 MMOL/L (ref 3.5–5.5)
Sodium, POC: 140 MMOL/L (ref 136–145)

## 2017-07-07 MED ORDER — IOPAMIDOL 61 % IV SOLN
300 mg iodine /mL (61 %) | INTRAVENOUS | Status: DC | PRN
Start: 2017-07-07 — End: 2017-07-08
  Administered 2017-07-07: 18:00:00

## 2017-07-07 MED ORDER — EPHEDRINE (PF) 50 MG/10 ML (5 MG/ML) IN NS IV SYRINGE
50 mg/10 mL (5 mg/mL) | INTRAVENOUS | Status: DC | PRN
Start: 2017-07-07 — End: 2017-07-07
  Administered 2017-07-07: 18:00:00 via INTRAVENOUS

## 2017-07-07 MED ORDER — MIDAZOLAM 1 MG/ML IJ SOLN
1 mg/mL | INTRAMUSCULAR | Status: AC
Start: 2017-07-07 — End: 2017-07-07

## 2017-07-07 MED ORDER — PROPOFOL 10 MG/ML IV EMUL
10 mg/mL | INTRAVENOUS | Status: DC | PRN
Start: 2017-07-07 — End: 2017-07-07
  Administered 2017-07-07: 17:00:00 via INTRAVENOUS

## 2017-07-07 MED ORDER — LIDOCAINE (PF) 20 MG/ML (2 %) IJ SOLN
20 mg/mL (2 %) | INTRAMUSCULAR | Status: DC | PRN
Start: 2017-07-07 — End: 2017-07-07
  Administered 2017-07-07: 17:00:00 via INTRAVENOUS

## 2017-07-07 MED ORDER — MIDAZOLAM 1 MG/ML IJ SOLN
1 mg/mL | INTRAMUSCULAR | Status: DC | PRN
Start: 2017-07-07 — End: 2017-07-07
  Administered 2017-07-07: 17:00:00 via INTRAVENOUS

## 2017-07-07 MED ORDER — SODIUM CHLORIDE 0.9 % IV
INTRAVENOUS | Status: DC | PRN
Start: 2017-07-07 — End: 2017-07-07
  Administered 2017-07-07: 17:00:00 via INTRAVENOUS

## 2017-07-07 MED ORDER — FENTANYL CITRATE (PF) 50 MCG/ML IJ SOLN
50 mcg/mL | INTRAMUSCULAR | Status: DC | PRN
Start: 2017-07-07 — End: 2017-07-07
  Administered 2017-07-07 (×4): via INTRAVENOUS

## 2017-07-07 MED ORDER — LIDOCAINE (PF) 10 MG/ML (1 %) IJ SOLN
10 mg/mL (1 %) | INTRAMUSCULAR | Status: DC | PRN
Start: 2017-07-07 — End: 2017-07-08
  Administered 2017-07-07: 18:00:00 via INTRADERMAL

## 2017-07-07 MED ORDER — FENTANYL CITRATE (PF) 50 MCG/ML IJ SOLN
50 mcg/mL | INTRAMUSCULAR | Status: AC
Start: 2017-07-07 — End: 2017-07-07

## 2017-07-07 MED ORDER — PROPOFOL INFUSION
INTRAVENOUS | Status: DC | PRN
Start: 2017-07-07 — End: 2017-07-07
  Administered 2017-07-07 (×2): via INTRAVENOUS

## 2017-07-07 MED ORDER — LIDOCAINE (PF) 10 MG/ML (1 %) IJ SOLN
10 mg/mL (1 %) | INTRAMUSCULAR | Status: AC
Start: 2017-07-07 — End: 2017-07-08

## 2017-07-07 MED FILL — MIDAZOLAM 1 MG/ML IJ SOLN: 1 mg/mL | INTRAMUSCULAR | Qty: 2

## 2017-07-07 MED FILL — FENTANYL CITRATE (PF) 50 MCG/ML IJ SOLN: 50 mcg/mL | INTRAMUSCULAR | Qty: 2

## 2017-07-07 MED FILL — XYLOCAINE-MPF 10 MG/ML (1 %) INJECTION SOLUTION: 10 mg/mL (1 %) | INTRAMUSCULAR | Qty: 30

## 2017-07-07 NOTE — Op Note (Signed)
[facility]  OPERATIVE REPORT    William Ruiz, William Ruiz  MR#: [id number]  DOB: Jul 20, 1966  ACCOUNT #: [id number]   DATE OF SERVICE: 07/07/2017    SURGEON:  Italyhad M. Sundra Haddix, DO    ASSISTANT:  0    PREOPERATIVE  DIAGNOSES:    1.  Aneurysmal AV graft, right arm.  2.  Dysfunctional graft, right arm.  3.  End-stage renal disease.    POSTOPERATIVE DIAGNOSES:  1.  Aneurysmal AV graft, right arm.  2.  Dysfunctional graft, right arm.  3.  End-stage renal disease.    PROCEDURES PERFORMED:  Right arm shuntogram with radiographic interpretation, reflux arteriogram for radiographic interpretation.    COMPLICATIONS:  Zero.    ESTIMATED BLOOD LOSS:  Zero.    CONDITION:  The patient is stable.    IMPLANTS:  None.    EXPLANTS:  None.    ANESTHESIA:  Moderate sedation.    SPECIMENS REMOVED:  0    COMPLICATIONS:  0    DETAILS OF PROCEDURE:  The patient is a 51 year old has been on dialysis now for 44 years.  He is having development of aneurysmal sites along this AV graft.  He is brought back to the lab today to evaluate for any outflow stenosis.  He had moderate sedation in supine position.  His right arm was prepped and draped in standard fashion followed by CDC compliant guidelines for aseptic technique, 1% lidocaine was used for local.  I accessed the graft with a single anterior stick placed a wire and then a sheath.  I compressed the graft, did a reflux arteriogram.  There appears to be an inflow procedure done for his graft, but it is a patent, his brachial artery to this bypass is patent.  The runoff is patent.  The inflow to the graft is brisk.  The first couple of centimeters of the graft appears normal and then the usable segment is multilobular aneurysmal and then the runoff again appears normal.  The anastomosis in the vein is patent.  Central venous runoff just a dilated vein with no stenosis.    RECOMMENDATIONS:  Would be for graft replacement while using a catheter in  the interim.  We will discuss this with him for the timing of this.  He tolerated today without any difficulty, removed the sheath, repaired the site with suture, transferred to recovery with a thrill in his graft.      ItalyHAD M. Phat Dalton, DO       CMM / BN  D: 07/07/2017 14:18     T: 07/07/2017 14:53  JOB #: [id number]

## 2017-07-07 NOTE — Anesthesia Pre-Procedure Evaluation (Signed)
Anesthetic History   No history of anesthetic complications            Review of Systems / Medical History  Patient summary reviewed and pertinent labs reviewed    Pulmonary  Within defined limits                 Neuro/Psych             Comments: Hypoxic brain injury Cardiovascular    Hypertension      CHF: NYHA Classification II, dyspnea on exertion  Dysrhythmias   Hyperlipidemia    Exercise tolerance: <4 METS  Comments: EF 45%    H/o cardiac arrest   GI/Hepatic/Renal         Renal disease: ESRD and dialysis       Endo/Other        Anemia     Other Findings   Comments: Documentation of current medication  Current medications obtained, documented and obtained? YES      Risk Factors for Postoperative nausea/vomiting:       History of postoperative nausea/vomiting?  NO       Male?  NO       Motion sickness?  NO       Intended opioid administration for postoperative analgesia?  NO      Smoking Abstinence:  Current Smoker?   NO  Elective Surgery? YES  Seen preoperatively by anesthesiologist or proxy prior to day of surgery?  YES  Pt abstained from smoking 24 hours prior to anesthesia? YES    Preventive care/screening for High Blood Pressure:  Aged 18 years and older: YES  Screened for high blood pressure: YES  Patients with high blood pressure referred to primary care provider   for BP management: YES                 Physical Exam    Airway  Mallampati: II  TM Distance: > 6 cm  Neck ROM: normal range of motion   Mouth opening: Normal     Cardiovascular  Regular rate and rhythm,  S1 and S2 normal,  no murmur, click, rub, or gallop             Dental  No notable dental hx       Pulmonary  Breath sounds clear to auscultation               Abdominal  GI exam deferred       Other Findings            Anesthetic Plan    ASA: 4  Anesthesia type: MAC          Induction: Intravenous  Anesthetic plan and risks discussed with: Patient

## 2017-07-07 NOTE — H&P (Signed)
History and Physical    William Ruiz is a new patient here to [place] today.  But he is not new at all to dialysis.  He recently moved here from [place] to help care for an ailing family member.  He has been on dialysis for 44 years, having had to start when he was about 51 years old due to severe hypertension and kidney failure.  He has had multiple left upper extremity accesses, a right forearm access, and then for 15 years he has maintained his current right arm fistula, which he says does have some graft component.  It been about 3 months since he last had intervention before he moved here.  When he comes in for today is that for the past few treatments he has had prolonged bleeding after his treatments.  This has included several ER visits.  He was referred for vascular evaluation.  ??  In spite of being on dialysis he is very healthy.  He says he runs about 6 miles per day and is very active.  He is very in tune with his diet and medication management, which is actually quite minimal, to maintain healthy lifestyle.  He understands the need for dialysis and values being able to try to keep his access functional.  ??       Past Medical History:   Diagnosis Date   ??? Benign essential HTN ??   ??? ESRD (end stage renal disease) on dialysis (HCC) ??   ??? HLD (hyperlipidemia) ??   ??       Patient Active Problem List   Diagnosis Code   ??? Encounter for dialysis (HCC) Z99.2   ??? Essential hypertension I10   ??? Mixed hyperlipidemia E78.2   ??? ESRD (end stage renal disease) on dialysis (HCC) N18.6, Z99.2   ??? Hyperphosphatemia E83.39   ??? Chest pain R07.9   ??        Past Surgical History:   Procedure Laterality Date   ??? HX ARTERIOVENOUS FISTULA ?? ??   ??         Current Outpatient Prescriptions   Medication Sig Dispense Refill   ??? folic acid/vit B complex and C (RENA-VITE PO) Take  by mouth. ?? ??   ??? atorvastatin (LIPITOR) 40 mg tablet Take 1 Tab by mouth nightly. 30 Tab 0   ??? docusate sodium (COLACE) 100 mg capsule Take 1 Cap by mouth two (2)  times daily as needed for Constipation. 30 Cap 0   ??? hydrALAZINE (APRESOLINE) 100 mg tablet Take 1 Tab by mouth three (3) times daily. 90 Tab 0   ??? ipratropium-albuterol (COMBIVENT RESPIMAT) 20-100 mcg/actuation inhaler Take 1 Puff by inhalation every six (6) hours as needed for Wheezing. 1 Inhaler 0   ??? calcium acetate (PHOSLO) 667 mg tab tablet Take 1,334 mg by mouth three (3) times daily (with meals). ?? ??   ??? aspirin delayed-release 81 mg tablet Take 81 mg by mouth every other day. ?? ??   ??       Allergies   Allergen Reactions   ??? Penicillins Anaphylaxis   ??? Compazine [Prochlorperazine] Anxiety   ??? Levofloxacin Other (comments)   ??? Vicodin [Hydrocodone-Acetaminophen] Other (comments)   ??  Social History   ??  Social History   ??? Marital status: SINGLE   ?? ?? Spouse name: [name]   ??? Number of children: [name]   ??? Years of education: [name]   ??      Occupational History   ???  Not on file.   ??       Social History Main Topics   ??? Smoking status: Never Smoker   ??? Smokeless tobacco: Never Used   ??? Alcohol use No   ??? Drug use: No   ??? Sexual activity: No   ??       Other Topics Concern   ??? Not on file   ??  Social History Narrative      No family history on file.  ??  Review of Systems    10 point ROS negative unless stated in HPI above   ??  Physical Exam:         Visit Vitals   ??? BP 122/70 (BP 1 Location: Left arm, BP Patient Position: Sitting)   ??? Pulse 68   ??? Resp 14   ??? Ht 5\' 7"  (1.702 m)   ??? Wt 155 lb (70.3 kg)   ??? BMI 24.28 kg/m2      General:  Alert, cooperative, no distress.   Head:  Normocephalic, without obvious abnormality, atraumatic.   Eyes:  ???? Conjunctivae/corneas clear. Pupils equal, round, reactive to light. Extraocular movements intact.   Extremities: Extremities normal, atraumatic, no cyanosis or edema.  Functional right upper arm fistula/graft. Some aneurysmal changes with some skin thinning but no ulcerations or active bleeding    Pulses: 2+ and symmetric all extremities.    Skin: Skin color, texture, turgor normal. No rashes or lesions.   ??  ??  Impression and Plan:  1. ESRD (end stage renal disease) on dialysis (HCC)    2. Complication of AV dialysis fistula, initial encounter    ??      Orders Placed This Encounter   ??? folic acid/vit B complex and C (RENA-VITE PO)   ??  I explained the episode the prolonged bleeding could be an outflow stenosis.  I do appreciate the areas of pseudoaneurysm and skin thinning, which may be something we would have to address down the road, I think for the time being just initial evaluation with a fistulogram with opportunity to do any intervention is reasonable.  He states he is very familiar with this having been through a number of times, and is anxious to do something as soon as possible because of these recent events.  In fact will be able to try to do this tomorrow prior to his scheduled dialysis treatment.  Dr. Ronne BinningMcKenzie also saw and evaluated the patient today as well

## 2017-07-07 NOTE — Progress Notes (Signed)
Cath holding summary    Patient escorted to cath holding from waiting area ambulatory, alert and oriented x 4, voicing no complaints.  Changed into gown and placed on monitor.  NPO since MN.  Lab results, med rec and H&P reviewed on chart.  PIV x 1 inserted without difficulty.    Family to bedside.

## 2017-07-07 NOTE — Progress Notes (Addendum)
I have reviewed discharge instructions with the patient.  The patient verbalized understanding.Patient armband removed and given to patient to take home.  Patient was informed of the privacy risks if armband lost or stolen.Pt ambulatory to lobby with steady gait, pt in no apparent distress.

## 2017-07-07 NOTE — Anesthesia Post-Procedure Evaluation (Signed)
Post-Anesthesia Evaluation and Assessment    Patient: William Ruiz MRN: [id number]  SSN: [id number]    Date of Birth: 1966-03-30  Age: 51 y.o.  Sex: male       Cardiovascular Function/Vital Signs  Visit Vitals   ??? BP 143/80   ??? Pulse 70   ??? Temp 36.4 ??C (97.5 ??F)   ??? Resp 10   ??? Ht 5' 7"  (1.702 m)   ??? Wt 73.9 kg (163 lb)   ??? SpO2 95%   ??? BMI 25.53 kg/m2       Patient is status post MAC anesthesia for Procedure(s):  Fistulogram Right.    Nausea/Vomiting: None    Postoperative hydration reviewed and adequate.    Pain:  Pain Scale 1: Numeric (0 - 10) (07/07/17 1343)  Pain Intensity 1: 0 (07/07/17 1343)   Managed    Neurological Status:       At baseline    Mental Status and Level of Consciousness: Arousable    Pulmonary Status:   O2 Device: Oxygen mask (07/07/17 1354)   Adequate oxygenation and airway patent    Complications related to anesthesia: None    Post-anesthesia assessment completed. No concerns      Signed By: Kathee Delton, MD     July 07, 2017

## 2017-07-07 NOTE — Op Note (Signed)
[facility]  OPERATIVE REPORT    [name], William  MR#: [id number]  DOB: [birth year]  ACCOUNT #: [id number]   DATE OF SERVICE: 07/07/2017    SURGEON:  Italyhad M. Blease Capaldi, DO    ASSISTANT:  0    PREOPERATIVE  DIAGNOSES:    1.  Aneurysmal AV graft, right arm.  2.  Dysfunctional graft, right arm.  3.  End-stage renal disease.    POSTOPERATIVE DIAGNOSES:  1.  Aneurysmal AV graft, right arm.  2.  Dysfunctional graft, right arm.  3.  End-stage renal disease.    PROCEDURES PERFORMED:  Right arm shuntogram with radiographic interpretation, reflux arteriogram for radiographic interpretation.    COMPLICATIONS:  Zero.    ESTIMATED BLOOD LOSS:  Zero.    CONDITION:  The patient is stable.    IMPLANTS:  None.    EXPLANTS:  None.    ANESTHESIA:  Moderate sedation.    SPECIMENS REMOVED:  0    COMPLICATIONS:  0    DETAILS OF PROCEDURE:  The patient is a 51 year old has been on dialysis now for 44 years.  He is having development of aneurysmal sites along this AV graft.  He is brought back to the lab today to evaluate for any outflow stenosis.  He had moderate sedation in supine position.  His right arm was prepped and draped in standard fashion followed by CDC compliant guidelines for aseptic technique, 1% lidocaine was used for local.  I accessed the graft with a single anterior stick placed a wire and then a sheath.  I compressed the graft, did a reflux arteriogram.  There appears to be an inflow procedure done for his graft, but it is a patent, his brachial artery to this bypass is patent.  The runoff is patent.  The inflow to the graft is brisk.  The first couple of centimeters of the graft appears normal and then the usable segment is multilobular aneurysmal and then the runoff again appears normal.  The anastomosis in the vein is patent.  Central venous runoff just a dilated vein with no stenosis.    RECOMMENDATIONS:  Would be for graft replacement while using a catheter in the interim.  We will discuss this with  him for the timing of this.  He tolerated today without any difficulty, removed the sheath, repaired the site with suture, transferred to recovery with a thrill in his graft.      ItalyHAD M. Darwin Rothlisberger, DO       CMM / BN  D: 07/07/2017 14:18     T: 07/07/2017 14:53  JOB #: [id number]

## 2017-07-09 MED FILL — XYLOCAINE-MPF 20 MG/ML (2 %) INJECTION SOLUTION: 20 mg/mL (2 %) | INTRAMUSCULAR | Qty: 5

## 2017-07-09 MED FILL — SODIUM CHLORIDE 0.9 % IV: INTRAVENOUS | Qty: 1000

## 2017-07-09 MED FILL — EPHEDRINE (PF) 50 MG/10 ML (5 MG/ML) IN NS IV SYRINGE: 50 mg/10 mL (5 mg/mL) | INTRAVENOUS | Qty: 10

## 2017-07-09 MED FILL — PROPOFOL 10 MG/ML IV EMUL: 10 mg/mL | INTRAVENOUS | Qty: 20

## 2017-07-20 ENCOUNTER — Ambulatory Visit: Attending: Physician Assistant | Primary: Family Medicine

## 2017-07-20 ENCOUNTER — Encounter: Attending: Physician Assistant | Primary: Family Medicine

## 2017-07-20 ENCOUNTER — Ambulatory Visit
Admit: 2017-07-20 | Discharge: 2017-07-20 | Payer: PRIVATE HEALTH INSURANCE | Attending: Physician Assistant | Primary: Family Medicine

## 2017-07-20 DIAGNOSIS — N186 End stage renal disease: Secondary | ICD-10-CM

## 2017-07-20 NOTE — Progress Notes (Signed)
William Ruiz had recent shuntogram to evaluate for prolonged bleeding at dialysis  He has had this for almost 16 years  [place]  He has had several ER visits for the bleeding  No  Ulcers  shuntogram did not evaluate any issues worth treating with stent/balloon  It was described as more degenerative and recommendations made for excision of the aneurysmal portion with new interposition graft to be placed same location  Then perm cath for interim use until new graft site ready to be used  He acknowledges an understanding and would like to go ahead and proceed as soon as possible

## 2017-07-20 NOTE — Progress Notes (Signed)
1. Have you been to an emergency room or urgent care clinic since your last visit?   No  Hospitalized since your last visit? If yes, where, when, and reason for visit?   No  2. Have you seen or consulted any other health care providers outside of the [facility] system since your last visit including any procedures, health maintenance items. If yes, where, when and reason for visit?

## 2017-07-27 ENCOUNTER — Ambulatory Visit: Admit: 2017-07-27 | Payer: MEDICAID | Primary: Family Medicine

## 2017-07-27 ENCOUNTER — Inpatient Hospital Stay: Payer: MEDICAID

## 2017-07-27 LAB — METABOLIC PANEL, BASIC
Anion gap: 13 mmol/L (ref 3.0–18)
BUN/Creatinine ratio: 4 — ABNORMAL LOW (ref 12–20)
BUN: 75 MG/DL — ABNORMAL HIGH (ref 7.0–18)
CO2: 23 mmol/L (ref 21–32)
Calcium: 8.8 MG/DL (ref 8.5–10.1)
Chloride: 104 mmol/L (ref 100–108)
Creatinine: 19.8 MG/DL — ABNORMAL HIGH (ref 0.6–1.3)
GFR est AA: 3 mL/min/{1.73_m2} — ABNORMAL LOW (ref >60)
GFR est non-AA: 3 mL/min/{1.73_m2} — ABNORMAL LOW (ref >60)
Glucose: 94 mg/dL (ref 74–99)
Potassium: 5.2 mmol/L (ref 3.5–5.5)
Sodium: 140 mmol/L (ref 136–145)

## 2017-07-27 MED ORDER — SODIUM CHLORIDE 0.9 % IJ SYRG
Freq: Three times a day (TID) | INTRAMUSCULAR | Status: DC
Start: 2017-07-27 — End: 2017-07-27

## 2017-07-27 MED ORDER — PROPOFOL 10 MG/ML IV EMUL
10 mg/mL | INTRAVENOUS | Status: AC
Start: 2017-07-27 — End: ?

## 2017-07-27 MED ORDER — FENTANYL CITRATE (PF) 50 MCG/ML IJ SOLN
50 mcg/mL | INTRAMUSCULAR | Status: AC | PRN
Start: 2017-07-27 — End: 2017-07-27
  Administered 2017-07-27 (×4): via INTRAVENOUS

## 2017-07-27 MED ORDER — LIDOCAINE (PF) 10 MG/ML (1 %) IJ SOLN
10 mg/mL (1 %) | INTRAMUSCULAR | Status: DC
Start: 2017-07-27 — End: 2017-07-27

## 2017-07-27 MED ORDER — HEPARIN (PORCINE) IN NS (PF) 1,000 UNIT/500 ML IV
1000 unit/500 mL | INTRAVENOUS | Status: DC | PRN
Start: 2017-07-27 — End: 2017-07-27
  Administered 2017-07-27: 19:00:00

## 2017-07-27 MED ORDER — OXYCODONE-ACETAMINOPHEN 5 MG-325 MG TAB
5-325 mg | Freq: Once | ORAL | Status: AC
Start: 2017-07-27 — End: 2017-07-27
  Administered 2017-07-27: 23:00:00 via ORAL

## 2017-07-27 MED ORDER — LIDOCAINE (PF) 20 MG/ML (2 %) IJ SOLN
20 mg/mL (2 %) | INTRAMUSCULAR | Status: DC | PRN
Start: 2017-07-27 — End: 2017-07-27
  Administered 2017-07-27: 18:00:00 via INTRAVENOUS

## 2017-07-27 MED ORDER — PROPOFOL 10 MG/ML IV EMUL
10 mg/mL | INTRAVENOUS | Status: DC | PRN
Start: 2017-07-27 — End: 2017-07-27
  Administered 2017-07-27 (×2): via INTRAVENOUS

## 2017-07-27 MED ORDER — HYDRALAZINE 20 MG/ML IJ SOLN
20 mg/mL | INTRAMUSCULAR | Status: AC
Start: 2017-07-27 — End: ?

## 2017-07-27 MED ORDER — LIDOCAINE (PF) 20 MG/ML (2 %) IJ SOLN
20 mg/mL (2 %) | INTRAMUSCULAR | Status: AC
Start: 2017-07-27 — End: ?

## 2017-07-27 MED ORDER — SODIUM CHLORIDE 0.9 % IJ SYRG
INTRAMUSCULAR | Status: DC | PRN
Start: 2017-07-27 — End: 2017-07-27

## 2017-07-27 MED ORDER — FENTANYL CITRATE (PF) 50 MCG/ML IJ SOLN
50 mcg/mL | INTRAMUSCULAR | Status: AC
Start: 2017-07-27 — End: ?

## 2017-07-27 MED ORDER — ONDANSETRON (PF) 4 MG/2 ML INJECTION
4 mg/2 mL | INTRAMUSCULAR | Status: DC | PRN
Start: 2017-07-27 — End: 2017-07-27
  Administered 2017-07-27: 23:00:00 via INTRAVENOUS

## 2017-07-27 MED ORDER — LIDOCAINE (PF) 10 MG/ML (1 %) IJ SOLN
10 mg/mL (1 %) | INTRAMUSCULAR | Status: DC | PRN
Start: 2017-07-27 — End: 2017-07-27
  Administered 2017-07-27 (×3): via SUBCUTANEOUS

## 2017-07-27 MED ORDER — HYDRALAZINE 20 MG/ML IJ SOLN
20 mg/mL | INTRAMUSCULAR | Status: DC | PRN
Start: 2017-07-27 — End: 2017-07-27
  Administered 2017-07-27 (×2): via INTRAVENOUS

## 2017-07-27 MED ORDER — LACTATED RINGERS IV
INTRAVENOUS | Status: DC
Start: 2017-07-27 — End: 2017-07-27

## 2017-07-27 MED ORDER — FENTANYL CITRATE (PF) 50 MCG/ML IJ SOLN
50 mcg/mL | INTRAMUSCULAR | Status: DC
Start: 2017-07-27 — End: 2017-07-27

## 2017-07-27 MED ORDER — FAMOTIDINE 20 MG TAB
20 mg | Freq: Once | ORAL | Status: AC
Start: 2017-07-27 — End: 2017-07-27
  Administered 2017-07-27: 14:00:00 via ORAL

## 2017-07-27 MED ORDER — FENTANYL CITRATE (PF) 50 MCG/ML IJ SOLN
50 mcg/mL | Freq: Once | INTRAMUSCULAR | Status: AC
Start: 2017-07-27 — End: 2017-07-27
  Administered 2017-07-27: 23:00:00 via INTRAVENOUS

## 2017-07-27 MED ORDER — HEPARIN (PORCINE) 1,000 UNIT/ML IJ SOLN
1000 unit/mL | INTRAMUSCULAR | Status: DC | PRN
Start: 2017-07-27 — End: 2017-07-27
  Administered 2017-07-27: 20:00:00 via INTRAVENOUS

## 2017-07-27 MED ORDER — HYDRALAZINE 20 MG/ML IJ SOLN
20 mg/mL | Freq: Once | INTRAMUSCULAR | Status: AC
Start: 2017-07-27 — End: 2017-07-27
  Administered 2017-07-27: 15:00:00 via INTRAVENOUS

## 2017-07-27 MED ORDER — HEPARIN (PORCINE) 1,000 UNIT/ML IJ SOLN
1000 unit/mL | INTRAMUSCULAR | Status: AC
Start: 2017-07-27 — End: ?

## 2017-07-27 MED ORDER — DEXTROSE 5%-1/4 NORMAL SALINE IV
INTRAVENOUS | Status: DC
Start: 2017-07-27 — End: 2017-07-27

## 2017-07-27 MED ORDER — DEXTROSE 5%-1/4 NORMAL SALINE IV
INTRAVENOUS | Status: DC
Start: 2017-07-27 — End: 2017-07-27
  Administered 2017-07-27: 14:00:00 via INTRAVENOUS

## 2017-07-27 MED ORDER — OXYCODONE-ACETAMINOPHEN 5 MG-325 MG TAB
5-325 mg | Freq: Once | ORAL | Status: AC | PRN
Start: 2017-07-27 — End: 2017-07-27
  Administered 2017-07-27: 22:00:00 via ORAL

## 2017-07-27 MED ORDER — INSULIN LISPRO 100 UNIT/ML INJECTION
100 unit/mL | Freq: Once | SUBCUTANEOUS | Status: DC
Start: 2017-07-27 — End: 2017-07-27
  Administered 2017-07-27: 14:00:00 via SUBCUTANEOUS

## 2017-07-27 MED ORDER — HEPARIN (PORCINE) 5,000 UNIT/ML IJ SOLN
5000 unit/mL | INTRAMUSCULAR | Status: DC
Start: 2017-07-27 — End: 2017-07-27

## 2017-07-27 MED ORDER — PROPOFOL INFUSION
INTRAVENOUS | Status: DC | PRN
Start: 2017-07-27 — End: 2017-07-27
  Administered 2017-07-27 (×2): via INTRAVENOUS

## 2017-07-27 MED ORDER — MIDAZOLAM 1 MG/ML IJ SOLN
1 mg/mL | INTRAMUSCULAR | Status: AC
Start: 2017-07-27 — End: ?

## 2017-07-27 MED ORDER — CLINDAMYCIN 600 MG/4 ML IV
600 mg/4 mL | Freq: Once | INTRAVENOUS | Status: AC
Start: 2017-07-27 — End: 2017-07-27
  Administered 2017-07-27: 18:00:00 via INTRAVENOUS

## 2017-07-27 MED ORDER — OXYCODONE-ACETAMINOPHEN 5 MG-325 MG TAB
5-325 mg | ORAL_TABLET | ORAL | 0 refills | Status: AC | PRN
Start: 2017-07-27 — End: ?

## 2017-07-27 MED ORDER — MIDAZOLAM 1 MG/ML IJ SOLN
1 mg/mL | INTRAMUSCULAR | Status: DC | PRN
Start: 2017-07-27 — End: 2017-07-27
  Administered 2017-07-27: 18:00:00 via INTRAVENOUS

## 2017-07-27 MED ORDER — HEPARIN (PORCINE) IN NS (PF) 1,000 UNIT/500 ML IV
1000 unit/500 mL | INTRAVENOUS | Status: DC
Start: 2017-07-27 — End: 2017-07-27

## 2017-07-27 MED ORDER — FENTANYL CITRATE (PF) 50 MCG/ML IJ SOLN
50 mcg/mL | INTRAMUSCULAR | Status: DC | PRN
Start: 2017-07-27 — End: 2017-07-27
  Administered 2017-07-27 (×4): via INTRAVENOUS

## 2017-07-27 MED FILL — FAMOTIDINE 20 MG TAB: 20 mg | ORAL | Qty: 1

## 2017-07-27 MED FILL — CLEOCIN 600 MG/4 ML INTRAVENOUS SOLUTION: 600 mg/4 mL | INTRAVENOUS | Qty: 4

## 2017-07-27 MED FILL — DEXTROSE 5%-1/4 NORMAL SALINE IV: INTRAVENOUS | Qty: 1000

## 2017-07-27 MED FILL — BD POSIFLUSH NORMAL SALINE 0.9 % INJECTION SYRINGE: INTRAMUSCULAR | Qty: 10

## 2017-07-27 MED FILL — HYDRALAZINE 20 MG/ML IJ SOLN: 20 mg/mL | INTRAMUSCULAR | Qty: 1

## 2017-07-27 MED FILL — XYLOCAINE-MPF 10 MG/ML (1 %) INJECTION SOLUTION: 10 mg/mL (1 %) | INTRAMUSCULAR | Qty: 30

## 2017-07-27 MED FILL — ONDANSETRON (PF) 4 MG/2 ML INJECTION: 4 mg/2 mL | INTRAMUSCULAR | Qty: 2

## 2017-07-27 MED FILL — HEPARIN (PORCINE) 5,000 UNIT/ML IJ SOLN: 5000 unit/mL | INTRAMUSCULAR | Qty: 2

## 2017-07-27 MED FILL — OXYCODONE-ACETAMINOPHEN 5 MG-325 MG TAB: 5-325 mg | ORAL | Qty: 1

## 2017-07-27 MED FILL — PROPOFOL 10 MG/ML IV EMUL: 10 mg/mL | INTRAVENOUS | Qty: 20

## 2017-07-27 MED FILL — LACTATED RINGERS IV: INTRAVENOUS | Qty: 1000

## 2017-07-27 MED FILL — MIDAZOLAM 1 MG/ML IJ SOLN: 1 mg/mL | INTRAMUSCULAR | Qty: 2

## 2017-07-27 MED FILL — HEPARIN (PORCINE) IN NS (PF) 1,000 UNIT/500 ML IV: 1000 unit/500 mL | INTRAVENOUS | Qty: 500

## 2017-07-27 MED FILL — HEPARIN (PORCINE) 1,000 UNIT/ML IJ SOLN: 1000 unit/mL | INTRAMUSCULAR | Qty: 10

## 2017-07-27 MED FILL — FENTANYL CITRATE (PF) 50 MCG/ML IJ SOLN: 50 mcg/mL | INTRAMUSCULAR | Qty: 2

## 2017-07-27 MED FILL — LIDOCAINE (PF) 20 MG/ML (2 %) IJ SOLN: 20 mg/mL (2 %) | INTRAMUSCULAR | Qty: 5

## 2017-07-27 NOTE — H&P (Signed)
Surgery History and Physical    Subjective:      William Ruiz is a 51 y.o. male who presents with right arm aneurysmal AVG with ESRD.    Patient Active Problem List    Diagnosis Date Noted   ??? Chest pain 06/21/2017   ??? ESRD (end stage renal disease) on dialysis (HCC) 03/23/2017   ??? Hyperphosphatemia 03/23/2017   ??? Encounter for dialysis (HCC) 03/21/2017   ??? Essential hypertension 03/21/2017   ??? Mixed hyperlipidemia 03/21/2017     Past Medical History:   Diagnosis Date   ??? Benign essential HTN    ??? Chronic kidney disease    ??? ESRD (end stage renal disease) on dialysis (HCC)    ??? HLD (hyperlipidemia)    ??? Pneumothorax 2013      Past Surgical History:   Procedure Laterality Date   ??? CHEST SURGERY PROCEDURE UNLISTED      hx of rib fractures with lung puncture   ??? HX ARTERIOVENOUS FISTULA     ??? HX OTHER SURGICAL     ??? HX UROLOGICAL      kidney transplants x 2   ??? HX VASCULAR ACCESS     ??? PR INTRO CATH DIALYSIS CIRCUIT DX ANGRPH FLUOR S&I [name] 07/07/2017    Fistulogram Right performed by [place] McKenzie, MD at [facility] CARDIAC CATH LAB      Social History   Substance Use Topics   ??? Smoking status: Never Smoker   ??? Smokeless tobacco: Never Used   ??? Alcohol use No      History reviewed. No pertinent family history.   Prior to Admission medications    Medication Sig Start Date End Date Taking? Authorizing Provider   carvedilol (COREG) 25 mg tablet Take 25 mg by mouth two (2) times daily (with meals).   Yes Historical Provider   folic acid/vit B complex and C (RENA-VITE PO) Take  by mouth.   Yes Historical Provider   atorvastatin (LIPITOR) 40 mg tablet Take 1 Tab by mouth nightly. 03/23/17  Yes Olushola Ilogho, PA-C   docusate sodium (COLACE) 100 mg capsule Take 1 Cap by mouth two (2) times daily as needed for Constipation. 03/23/17  Yes Olushola Ilogho, PA-C   hydrALAZINE (APRESOLINE) 100 mg tablet Take 1 Tab by mouth three (3) times daily. 03/23/17  Yes Olushola Ilogho, PA-C    ipratropium-albuterol (COMBIVENT RESPIMAT) 20-100 mcg/actuation inhaler Take 1 Puff by inhalation every six (6) hours as needed for Wheezing. 03/23/17  Yes Olushola Ilogho, PA-C   calcium acetate (PHOSLO) 667 mg tab tablet Take 1,334 mg by mouth three (3) times daily (with meals).   Yes Phys Other, MD   aspirin delayed-release 81 mg tablet Take 81 mg by mouth every other day.   Yes Phys Other, MD     Allergies   Allergen Reactions   ??? Penicillins Anaphylaxis   ??? Compazine [Prochlorperazine] Anxiety   ??? Levofloxacin Other (comments)   ??? Vicodin [Hydrocodone-Acetaminophen] Other (comments)         ROS:  Pertinent items are noted in HPI.  Unless otherwise mentioned in the HPI.    Objective:     Patient Vitals for the past 8 hrs:   BP Temp Pulse Resp SpO2   07/27/17 1111 172/85 - 73 - -   07/27/17 1101 171/86 - - - -   07/27/17 1054 172/84 - 70 - -   07/27/17 1041 (!) 194/94 - 70 - -   07/27/17 1040 (!) 194/93 -  63 - -   07/27/17 1031 (!) 198/94 - 63 - -   07/27/17 1012 (!) 209/102 97.5 ??F (36.4 ??C) 68 18 96 %       Temp (24hrs), Avg:97.5 ??F (36.4 ??C), Min:97.5 ??F (36.4 ??C), Max:97.5 ??F (36.4 ??C)      Physical Exam:  GENERAL: alert, cooperative, no distress, appears stated age, THROAT & NECK: normal and no erythema or exudates noted. , LUNG: clear to auscultation bilaterally, HEART: regular rate and rhythm, S1, S2 normal, no murmur, click, rub or gallop, ABDOMEN: soft, non-tender. Bowel sounds normal. No masses,  no organomegaly    Labs:   Recent Results (from the past 24 hour(s))   METABOLIC PANEL, BASIC    Collection Time: 07/27/17  9:44 AM   Result Value Ref Range    Sodium 140 136 - 145 mmol/L    Potassium 5.2 3.5 - 5.5 mmol/L    Chloride 104 100 - 108 mmol/L    CO2 23 21 - 32 mmol/L    Anion gap 13 3.0 - 18 mmol/L    Glucose 94 74 - 99 mg/dL    BUN 75 (H) 7.0 - 18 MG/DL    Creatinine 56.2119.80 (H) 0.6 - 1.3 MG/DL    BUN/Creatinine ratio 4 (L) 12 - 20      GFR est AA 3 (L) >60 ml/min/1.4073m2     GFR est non-AA 3 (L) >60 ml/min/1.3973m2    Calcium 8.8 8.5 - 10.1 MG/DL       Data Review:    CBC:   Lab Results   Component Value Date/Time    WBC 5.2 06/23/2017 04:10 AM    RBC 3.94 (L) 06/23/2017 04:10 AM    HGB 13.1 06/23/2017 04:10 AM    HCT 37.8 06/23/2017 04:10 AM    PLATELET 124 (L) 06/23/2017 04:10 AM      BMP:   Lab Results   Component Value Date/Time    Glucose 94 07/27/2017 09:44 AM    Sodium 140 07/27/2017 09:44 AM    Potassium 5.2 07/27/2017 09:44 AM    Chloride 104 07/27/2017 09:44 AM    CO2 23 07/27/2017 09:44 AM    BUN 75 (H) 07/27/2017 09:44 AM    Creatinine 19.80 (H) 07/27/2017 09:44 AM    Calcium 8.8 07/27/2017 09:44 AM     Coagulation:   Lab Results   Component Value Date/Time    Prothrombin time 14.2 03/22/2017 02:46 AM    INR 1.2 03/22/2017 02:46 AM    aPTT 37.1 (H) 03/22/2017 02:46 AM       Assessment:     Active Problems:    * No active hospital problems. *      Plan:     Right arm AVG revision with permcath placement.    Signed By: Oneita HurtMarc A Soleil Mas, MD     July 27, 2017

## 2017-07-27 NOTE — Anesthesia Post-Procedure Evaluation (Signed)
Post-Anesthesia Evaluation and Assessment    Patient: William Ruiz MRN: [id number]  SSN: [id number]    Date of Birth: [birth year]  Age: 51 y.o.  Sex: male       Cardiovascular Function/Vital Signs  Visit Vitals   ??? BP 146/77   ??? Pulse (!) 57   ??? Temp 36.4 ??C (97.5 ??F)   ??? Resp 12   ??? Ht 5' 7"  (1.702 m)   ??? Wt 73.5 kg (162 lb)   ??? SpO2 98%   ??? BMI 25.37 kg/m2       Patient is status post MAC anesthesia for Procedure(s):  RIGHT ARM EXCISION OF ANEURYSMAL GRAFT/REPLACE RIGHT ARM ARERIOVENOUS GRAFT  INFUSION CATHETER INSERTION.    Nausea/Vomiting: None    Postoperative hydration reviewed and adequate.    Pain:  Pain Scale 1: Numeric (0 - 10) (07/27/17 1652)  Pain Intensity 1: 8 (07/27/17 1701)   Managed    Neurological Status:   Neuro (WDL): Within Defined Limits (07/27/17 1641)   At baseline    Mental Status and Level of Consciousness: Arousable    Pulmonary Status:   O2 Device: Nasal cannula (07/27/17 1641)   Adequate oxygenation and airway patent    Complications related to anesthesia: None    Post-anesthesia assessment completed. No concerns    Signed By: Pennie Rushing, MD     July 27, 2017

## 2017-07-27 NOTE — Anesthesia Pre-Procedure Evaluation (Signed)
Anesthetic History   No history of anesthetic complications            Review of Systems / Medical History  Patient summary reviewed and pertinent labs reviewed    Pulmonary  Within defined limits                 Neuro/Psych   Within defined limits           Cardiovascular    Hypertension              Exercise tolerance: <4 METS     GI/Hepatic/Renal         Renal disease: ESRD and dialysis       Endo/Other  Within defined limits           Other Findings   Comments: Documentation of current medication  Current medications obtained, documented and obtained? YES      Risk Factors for Postoperative nausea/vomiting:       History of postoperative nausea/vomiting?  NO       Male?  NO       Motion sickness?  NO       Intended opioid administration for postoperative analgesia?  YES      Smoking Abstinence:  Current Smoker?   NO  Elective Surgery? YES  Seen preoperatively by anesthesiologist or proxy prior to day of surgery?  YES  Pt abstained from smoking 24 hours prior to anesthesia? [name]    Preventive care/screening for High Blood Pressure:  Aged 18 years and older: YES  Screened for high blood pressure: YES  Patients with high blood pressure referred to primary care provider   for BP management: YES                 Physical Exam    Airway  Mallampati: II  TM Distance: 4 - 6 cm  Neck ROM: normal range of motion   Mouth opening: Normal     Cardiovascular  Regular rate and rhythm,  S1 and S2 normal,  no murmur, click, rub, or gallop             Dental    Dentition: Full upper dentures and Edentulous     Pulmonary  Breath sounds clear to auscultation               Abdominal  GI exam deferred       Other Findings            Anesthetic Plan    ASA: 4  Anesthesia type: MAC          Induction: Intravenous  Anesthetic plan and risks discussed with: Patient

## 2017-07-27 NOTE — Brief Op Note (Signed)
BRIEF OPERATIVE NOTE    Date of Procedure: 07/27/2017   Preoperative Diagnosis: END STAGE RENAL DISEASE  ACCESS COMPLICATION  Postoperative Diagnosis: END STAGE RENAL DISEASE  ACCESS COMPLICATION    Procedure(s):  RIGHT ARM EXCISION OF ANEURYSMAL GRAFT/REPLACE RIGHT ARM ARERIOVENOUS GRAFT  INFUSION CATHETER INSERTION  Surgeon(s) and Role:     * Mickel Crow, MD - Primary         Surgical Assistant: none    Surgical Staff:  Circ-Relief: Ephriam Jenkins, RN  Scrub Tech-1: Almeta Monas  Scrub RN-1: Selinda Michaels  Surg Asst-1: Keane Police  Event Time In   Incision Start 1430   Incision Close      Anesthesia: MAC   Estimated Blood Loss: 27m  Specimens: * No specimens in log *   Findings: heavily scarred access   Complications: none  Implants:   Implant Name Type Inv. Item Serial No. Manufacturer Lot No. LRB No. Used Action   GRAFT VASC [id number] TAPR -- ACUSEAL - [id number]   GRAFT VASC [id number] TAPR -- ACUSEAL [id number] [employer]   Right 1 Implanted

## 2017-07-27 NOTE — Op Note (Signed)
[name]  OPERATIVE REPORT    William Ruiz, [name]  MR#: [id number]  DOB: [birth year]  ACCOUNT #: [id number]   DATE OF SERVICE: 07/27/2017    SURGEON:  Avis EpleyMarc Anthony Kerra Guilfoil, MD    PREOPERATIVE DIAGNOSIS:  End-stage renal disease with aneurysmal arteriovenous graft in need of revision.    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    PROCEDURES PERFORMED:  1.  Ultrasound-guided access of left internal jugular vein.  2.  Permanent dialysis catheter placement 23 cm Palindrome tunneled catheter through a left IJ approach.  3.  Right arm AV graft revision using 7 mm Acuseal graft interposition grafting.  4.  Difficult exposure redo of the proximal distal graft necessitating an extra 45 minutes of surgery beyond normal.      CULTURES:  None.    SPECIMENS REMOVED:  None.    DRAINS:  None.    ESTIMATED BLOOD LOSS:  150 mL.    ANESTHESIA:  MAC with local anesthetic.    COMPLICATIONS:  None.    IMPLANTS:  As above.    ASSISTANTS:  None.    INDICATION FOR THE PROCEDURE:  The patient is a 51 year old gentleman with a right arm AV graft with aneurysms.  The patient was recommended for interposition grafting with revision.  Patient was given the risks and benefits of the procedure including but not limited to bleeding, infection, damage to tissue structures, MI, stroke, and death as well as loss of access and loss of upper extremity.  Patient was understanding of all the risks and underwent the procedure.    DESCRIPTION OF PROCEDURE:  The patient was correctly identified in the preoperative holding area, taken to the operating room in stable condition.  The patient had a preincision timeout prior to incision.  The patient was prepped and draped in normal sterile fashion according to the CDC guidelines of aseptic technique.  We then were able to use an ultrasound, we visualized the right internal jugular vein.  This was  patent within the kind of mid neck area, but then as it went further distal; it became more and more sclerotic, so it was decided to just go ahead and place the access on the left side.  The left internal jugular vein was appropriate size and caliber and picture was taken and kept in the patient's chart.  We then numbed her up appropriately using 1% lidocaine; took a single wall entry needle and gained access into the internal jugular vein.  Once the needle tip was identified within the vein and there was positive blood return; a wire was then placed.  A small skin incision was created using an 11 blade.  We dilated the tract x2 and then peel-away sheath was then placed over a wire.  We then went 2 fingerbreadths below the clavicle, numbed this area up, numbed up our tract, tunneled our 23 cm Palindrome catheter; removed the dilator and wire, placed the catheter down, removed the peel-away sheath.  The tip of the catheter was within the sinonasal junction.  There was good curvature of the catheter without any kinks and there was no evidence of pneumothorax.  We then were able to secure the catheter with 2-0 Prolene suture at both butterfly holes and the catheter insertion site.  Biopatch and Tegaderm were placed.  Both ports were easily flushed and aspirated 1.9 mL hot heparin were placed in each port, capped and wrapped with 4 x 4 and tape.  We secured the IJ incision with a 4-0 Vicryl subcuticular stitch and Dermabond for dressing.  Biopatch and Tegaderm were placed at the catheter insertion site.  Patient tolerated that portion of the procedure.      We then reprepped and draped the patient for the AV graft revision on the right upper extremity.  We then were able to numb him up appropriately towards the arterial inflow; dissect down to the graft, this was extraordinarily difficult.  At this point, we identified the fact that the patient did not have a previous PTFE graft, but likely a previous biologic  graft that was in place.  It was extraordinarily difficult to get the scar out from the graft and took a lot of extra effort but we were eventually successful and able to get a red vessel loop around the previous biologic graft, did not feel that we would be able to POTS this appropriately, so we just went ahead and did a single vessel loop around it.  We then did the same near the venous outflow; numbed him up appropriately, dissected down and again heavy scar was in this area and eventually we were able to get around the graft, not as bad as the arterial side, but still pretty poor scar formation.  We then were able to tunnel using a Gore tunneler and then place a 4 x 7 tapered Acuseal graft removing the tapered portion, so that it was just a 7 mm graft.  Once this was done, we heparinized the patient appropriately; transected the graft at the arterial side, cut our graft appropriately to size.  Created anastomosis with CV5 suture.  One repair suture was required.  Surgicel was then placed.  We then transected the biologic at the venous side and using a clamp, we were able to cut our graft appropriately to size and then using CV5 suture placed everything together.  No repair sutures were required.  Surgicel was placed.  We had created anastomosis on both sides, but did have some tract ooze, but we had a great thrill at the end of the case, so we went ahead and copiously irrigated, closed with a 3-0 Vicryl deep dermal layer, a 4-0 Vicryl subcuticular layer and Dermabond for dressing.  Patient tolerated the procedure well and was taken to the postoperative care unit in stable condition.      Alex Gardener, MD       MAC / EN  D: 07/27/2017 17:44     T: 07/28/2017 08:37  JOB #: [id number]

## 2017-07-27 NOTE — Op Note (Signed)
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    PROCEDURES PERFORMED:  1.  Ultrasound-guided access of left internal jugular vein.  2.  Permanent dialysis catheter placement 23 cm Palindrome tunneled catheter through a left IJ approach.  3.  Right arm AV graft revision using 7 mm Acuseal graft interposition grafting.  4.  Difficult exposure redo of the proximal distal graft necessitating an extra 45 minutes of surgery beyond normal.      CULTURES:  None.    SPECIMENS REMOVED:  None.    DRAINS:  None.    ESTIMATED BLOOD LOSS:  150 mL.    ANESTHESIA:  MAC with local anesthetic.    COMPLICATIONS:  None.    IMPLANTS:  As above.    ASSISTANTS:  None.    INDICATION FOR THE PROCEDURE:  The patient is a 51 year old gentleman with a right arm AV graft with aneurysms.  The patient was recommended for interposition grafting with revision.  Patient was given the risks and benefits of the procedure including but not limited to bleeding, infection, damage to tissue structures, MI, stroke, and death as well as loss of access and loss of upper extremity.  Patient was understanding of all the risks and underwent the procedure.    DESCRIPTION OF PROCEDURE:  The patient was correctly identified in the preoperative holding area, taken to the operating room in stable condition.  The patient had a preincision timeout prior to incision.  The patient was prepped and draped in normal sterile fashion according to the CDC guidelines of aseptic technique.  We then were able to use an ultrasound, we visualized the right internal jugular vein.  This was patent within the kind of mid neck  area, but then as it went further distal; it became more and more sclerotic, so it was decided to just go ahead and place the access on the left side.  The left internal jugular vein was appropriate size and caliber and picture was taken and kept in the patient's chart.  We then numbed her up appropriately using 1% lidocaine; took a single wall entry needle and gained access into the internal jugular vein.  Once the needle tip was identified within the vein and there was positive blood return; a wire was then placed.  A small skin incision was created using an 11 blade.  We dilated the tract x2 and then peel-away sheath was then placed over a wire.  We then went 2 fingerbreadths below the clavicle, numbed this area up, numbed up our tract, tunneled our 23 cm Palindrome catheter; removed the dilator and wire, placed the catheter down, removed the peel-away sheath.  The tip of the catheter was within the sinonasal junction.  There was good curvature of the catheter without any kinks and there was no evidence of pneumothorax.  We then were able to secure the catheter with 2-0 Prolene suture at both butterfly holes and the catheter insertion site.  Biopatch and Tegaderm were placed.  Both ports were easily flushed and aspirated 1.9 mL hot heparin were placed in each port, capped and wrapped with 4 x 4 and tape.  We secured the IJ incision with a 4-0 Vicryl subcuticular stitch and Dermabond for dressing.  Biopatch and Tegaderm were placed at the catheter insertion site.  Patient tolerated that portion of the procedure.      We then reprepped and draped the patient for the AV graft revision on the right upper extremity.  We then were able to numb him up appropriately towards the arterial inflow; dissect down to the graft, this was extraordinarily difficult.  At this point, we identified the fact that the patient did not have a previous PTFE graft, but likely a previous biologic graft that was in place.  It was  extraordinarily difficult to get the scar out from the graft and took a lot of extra effort but we were eventually successful and able to get a red vessel loop around the previous biologic graft, did not feel that we would be able to POTS this appropriately, so we just went ahead and did a single vessel loop around it.  We then did the same near the venous outflow; numbed him up appropriately, dissected down and again heavy scar was in this area and eventually we were able to get around the graft, not as bad as the arterial side, but still pretty poor scar formation.  We then were able to tunnel using a Gore tunneler and then place a 4 x 7 tapered Acuseal graft removing the tapered portion, so that it was just a 7 mm graft.  Once this was done, we heparinized the patient appropriately; transected the graft at the arterial side, cut our graft appropriately to size.  Created anastomosis with CV5 suture.  One repair suture was required.  Surgicel was then placed.  We then transected the biologic at the venous side and using a clamp, we were able to cut our graft appropriately to size and then using CV5 suture placed everything together.  No repair sutures were required.  Surgicel was placed.  We had created anastomosis on both sides, but did have some tract ooze, but we had a great thrill at the end of the case, so we went ahead and copiously irrigated, closed with a 3-0 Vicryl deep dermal layer, a 4-0 Vicryl subcuticular layer and Dermabond for dressing.  Patient tolerated the procedure well and was taken to the postoperative care unit in stable condition.      Alex Gardener, MD       MAC / EN  D: 07/27/2017 17:44     T: 07/28/2017 08:37  JOB #: [id number]

## 2017-07-28 MED FILL — XYLOCAINE-MPF 20 MG/ML (2 %) INJECTION SOLUTION: 20 mg/mL (2 %) | INTRAMUSCULAR | Qty: 5

## 2017-07-28 MED FILL — HYDRALAZINE 20 MG/ML IJ SOLN: 20 mg/mL | INTRAMUSCULAR | Qty: 1

## 2017-07-28 MED FILL — PROPOFOL 10 MG/ML IV EMUL: 10 mg/mL | INTRAVENOUS | Qty: 80

## 2017-07-28 MED FILL — HEPARIN (PORCINE) 1,000 UNIT/ML IJ SOLN: 1000 unit/mL | INTRAMUSCULAR | Qty: 10

## 2017-08-04 ENCOUNTER — Ambulatory Visit
Admit: 2017-08-04 | Discharge: 2017-08-04 | Payer: PRIVATE HEALTH INSURANCE | Attending: Physician Assistant | Primary: Family Medicine

## 2017-08-04 DIAGNOSIS — N186 End stage renal disease: Secondary | ICD-10-CM

## 2017-08-04 NOTE — Progress Notes (Signed)
Subjective:      William Ruiz is a 51 y.o. male who presents today for post op visit, status post Right arm AV graft revision using 7 mm Acuseal graft interposition grafting, with difficult exposure redo of the proximal distal graft necessitating an extra 45 minutes of surgery beyond normal. This was done due to aneurysmal graft and he had been experiencing prolonged bleeding. He also now has perm cath for his dialysis He has a surgical incision wound which is located on the right upper arm.   Current symptoms: pain, swelling, bruising. Occasional bloody drainage on his dressings      Objective:     Visit Vitals   ??? BP 140/90 (BP 1 Location: Left arm, BP Patient Position: Sitting)   ??? Pulse 76   ??? Resp 17   ??? Ht 5\' 7"  (1.702 m)   ??? Wt 162 lb (73.5 kg)   ??? BMI 25.37 kg/m2       Wound:   graft is patent with good bruit  But notable edema and ecchymosis of arm  Incisions intact but appears he may have had some blisters from the edema which are now unroofed and causing the drainage. None currently  Site was cleaned and bacitracin/mepilex and light tubi grip compression were applied     Assessment:     Wound check/discharge visit.      Plan:       ICD-10-CM ICD-9-CM    1. ESRD (end stage renal disease) on dialysis (HCC) N18.6 585.6     Z99.2 V45.11      No orders of the defined types were placed in this encounter.    Will recheck in a couple weeks to see if improved  Encouraged elevation, ice/warm compresses as tolerated, wound care as instructed and supplies provided; compression sleeve as necessary as well    Follow-up Disposition: Not on File    Marlane Hatcherracy L Elisama Thissen, PA    Portions of this note have been entered using voice recognition software.

## 2017-08-04 NOTE — Progress Notes (Signed)
1. Have you been to an emergency room or urgent care clinic since your last visit? no    Hospitalized since your last visit? If yes, where, when, and reason for visit? no  2. Have you seen or consulted any other health care providers outside of the [facility] system since your last visit including any procedures, health maintenance items. If yes, where, when and reason for visit? no

## 2017-08-04 NOTE — Progress Notes (Signed)
Reviewed and agree with PA note.

## 2017-08-18 ENCOUNTER — Ambulatory Visit: Admit: 2017-08-18 | Discharge: 2017-08-18 | Payer: PRIVATE HEALTH INSURANCE | Primary: Family Medicine

## 2017-08-18 DIAGNOSIS — N186 End stage renal disease: Secondary | ICD-10-CM

## 2017-08-18 NOTE — Progress Notes (Addendum)
Patient being seen for assessment of right arm status post Right arm AV graft revision using 7 mm Acuseal graft interposition grafting.  Patient's swelling and bruising has decreased some but still notable.  Graft has bruit and thrill present.  Had Beverely Lowracy Knaak, PA assess and have given the okay to start cannulation to new graft next Tuesday and advised patient to continue with warm compresses, elevation and use of tubigrip compression sleeve to help with swelling and bruising.  Patient is to call with any questions or concerns.

## 2017-08-20 ENCOUNTER — Encounter

## 2017-08-20 ENCOUNTER — Inpatient Hospital Stay: Admit: 2017-08-20 | Payer: MEDICAID | Primary: Family Medicine

## 2017-08-20 DIAGNOSIS — R0602 Shortness of breath: Secondary | ICD-10-CM

## 2018-10-22 NOTE — Committee Review (Signed)
01/03/2014   lmoretta   Team says close case as patient's insurance is not contracted with our center.

## 2019-10-27 NOTE — Progress Notes (Signed)
returned call to Lelon Perla SW from [facility] on another  pt.  Inquired on pt Mr. Danny Reyes.   Made aware that per Risk Management  advised the Transplant Dept that patient should be turned down by  our facility for transplant consideration.  Therefore, he should not send a  referral to [facility]    Electronically entered by: Alveda Reasons on 10/27/2019  10:36:00 AM

## 2021-04-28 ENCOUNTER — Telehealth: Payer: Self-pay | Admitting: Internal Medicine

## 2021-04-28 DIAGNOSIS — N186 End stage renal disease: Secondary | ICD-10-CM

## 2021-04-28 NOTE — Telephone Encounter (Signed)
Referral denied: Patient insurance not contracted    BMI: 22.80    Unver[employer] insurer: [employer]  ID: [id number]  Secondary insurer: Not included  ID: [name]  Exact name on card, if included: Not included
